# Patient Record
Sex: Female | Born: 1935 | Race: White | Hispanic: No | Marital: Married | Smoking: Never smoker
Health system: Southern US, Community
[De-identification: ages and names within clinical notes are randomized; demographics above are authoritative.]

## PROBLEM LIST (undated history)

## (undated) DIAGNOSIS — E119 Type 2 diabetes mellitus without complications: Secondary | ICD-10-CM

## (undated) DIAGNOSIS — I639 Cerebral infarction, unspecified: Secondary | ICD-10-CM

## (undated) DIAGNOSIS — F0390 Unspecified dementia without behavioral disturbance: Secondary | ICD-10-CM

## (undated) DIAGNOSIS — I509 Heart failure, unspecified: Secondary | ICD-10-CM

## (undated) HISTORY — PX: CHOLECYSTECTOMY: SHX55

---

## 1999-05-06 ENCOUNTER — Encounter: Admission: RE | Admit: 1999-05-06 | Discharge: 1999-08-04 | Payer: Self-pay | Admitting: Family Medicine

## 1999-08-20 ENCOUNTER — Encounter: Payer: Self-pay | Admitting: Family Medicine

## 1999-08-20 ENCOUNTER — Encounter: Admission: RE | Admit: 1999-08-20 | Discharge: 1999-08-20 | Payer: Self-pay | Admitting: Family Medicine

## 2000-05-13 ENCOUNTER — Ambulatory Visit: Admission: RE | Admit: 2000-05-13 | Discharge: 2000-05-13 | Payer: Self-pay | Admitting: Family Medicine

## 2000-09-17 ENCOUNTER — Encounter: Payer: Self-pay | Admitting: Family Medicine

## 2000-09-17 LAB — CONVERTED CEMR LAB: Pap Smear: NORMAL

## 2000-10-05 ENCOUNTER — Other Ambulatory Visit: Admission: RE | Admit: 2000-10-05 | Discharge: 2000-10-05 | Payer: Self-pay | Admitting: Family Medicine

## 2000-10-14 ENCOUNTER — Encounter: Admission: RE | Admit: 2000-10-14 | Discharge: 2000-10-14 | Payer: Self-pay | Admitting: Family Medicine

## 2000-10-14 ENCOUNTER — Encounter: Payer: Self-pay | Admitting: Family Medicine

## 2000-11-05 ENCOUNTER — Encounter: Admission: RE | Admit: 2000-11-05 | Discharge: 2000-11-05 | Payer: Self-pay | Admitting: Family Medicine

## 2000-11-05 ENCOUNTER — Encounter: Payer: Self-pay | Admitting: Family Medicine

## 2002-04-28 ENCOUNTER — Encounter: Payer: Self-pay | Admitting: Family Medicine

## 2002-04-28 ENCOUNTER — Encounter: Admission: RE | Admit: 2002-04-28 | Discharge: 2002-04-28 | Payer: Self-pay | Admitting: Family Medicine

## 2003-11-13 ENCOUNTER — Encounter: Admission: RE | Admit: 2003-11-13 | Discharge: 2003-11-13 | Payer: Self-pay | Admitting: Family Medicine

## 2003-11-13 ENCOUNTER — Encounter (INDEPENDENT_AMBULATORY_CARE_PROVIDER_SITE_OTHER): Payer: Self-pay | Admitting: Internal Medicine

## 2004-09-17 ENCOUNTER — Ambulatory Visit: Payer: Self-pay | Admitting: Family Medicine

## 2004-10-22 ENCOUNTER — Ambulatory Visit: Payer: Self-pay | Admitting: Family Medicine

## 2004-11-12 ENCOUNTER — Encounter (INDEPENDENT_AMBULATORY_CARE_PROVIDER_SITE_OTHER): Payer: Self-pay | Admitting: Internal Medicine

## 2004-11-24 ENCOUNTER — Ambulatory Visit: Payer: Self-pay | Admitting: Family Medicine

## 2004-12-07 ENCOUNTER — Encounter: Admission: RE | Admit: 2004-12-07 | Discharge: 2004-12-07 | Payer: Self-pay

## 2005-02-10 ENCOUNTER — Emergency Department (HOSPITAL_COMMUNITY): Admission: EM | Admit: 2005-02-10 | Discharge: 2005-02-10 | Payer: Self-pay | Admitting: Emergency Medicine

## 2005-02-11 ENCOUNTER — Ambulatory Visit: Payer: Self-pay | Admitting: Family Medicine

## 2005-02-13 ENCOUNTER — Ambulatory Visit: Payer: Self-pay | Admitting: Family Medicine

## 2005-03-24 ENCOUNTER — Ambulatory Visit: Payer: Self-pay | Admitting: Family Medicine

## 2005-05-06 ENCOUNTER — Ambulatory Visit: Payer: Self-pay | Admitting: Family Medicine

## 2005-05-27 ENCOUNTER — Ambulatory Visit: Payer: Self-pay | Admitting: Family Medicine

## 2005-05-29 ENCOUNTER — Ambulatory Visit: Payer: Self-pay | Admitting: Internal Medicine

## 2005-06-30 ENCOUNTER — Ambulatory Visit: Payer: Self-pay | Admitting: Family Medicine

## 2005-07-29 ENCOUNTER — Ambulatory Visit: Payer: Self-pay | Admitting: Family Medicine

## 2005-11-24 ENCOUNTER — Ambulatory Visit: Payer: Self-pay | Admitting: Family Medicine

## 2005-11-25 ENCOUNTER — Ambulatory Visit: Payer: Self-pay | Admitting: Cardiology

## 2005-12-01 ENCOUNTER — Ambulatory Visit: Payer: Self-pay | Admitting: Family Medicine

## 2005-12-04 ENCOUNTER — Ambulatory Visit: Payer: Self-pay | Admitting: Family Medicine

## 2005-12-08 ENCOUNTER — Ambulatory Visit: Payer: Self-pay

## 2006-01-04 ENCOUNTER — Ambulatory Visit: Payer: Self-pay | Admitting: Family Medicine

## 2006-01-14 ENCOUNTER — Encounter (INDEPENDENT_AMBULATORY_CARE_PROVIDER_SITE_OTHER): Payer: Self-pay | Admitting: Internal Medicine

## 2006-02-03 ENCOUNTER — Ambulatory Visit: Payer: Self-pay | Admitting: Family Medicine

## 2006-07-26 ENCOUNTER — Ambulatory Visit: Payer: Self-pay | Admitting: Family Medicine

## 2006-07-29 ENCOUNTER — Ambulatory Visit: Payer: Self-pay | Admitting: Family Medicine

## 2006-07-29 LAB — CONVERTED CEMR LAB
CO2: 27 meq/L (ref 19–32)
Creatinine, Ser: 1.7 mg/dL — ABNORMAL HIGH (ref 0.4–1.2)
Glucose, Bld: 136 mg/dL — ABNORMAL HIGH (ref 70–99)
Microalb, Ur: 1.2 mg/dL (ref 0.0–1.9)
Potassium: 4.2 meq/L (ref 3.5–5.1)
Sodium: 136 meq/L (ref 135–145)

## 2006-08-04 ENCOUNTER — Ambulatory Visit: Payer: Self-pay | Admitting: Family Medicine

## 2006-09-28 ENCOUNTER — Ambulatory Visit: Payer: Self-pay | Admitting: Family Medicine

## 2006-09-28 LAB — CONVERTED CEMR LAB
BUN: 28 mg/dL — ABNORMAL HIGH (ref 6–23)
CO2: 27 meq/L (ref 19–32)
Calcium: 9.5 mg/dL (ref 8.4–10.5)
Chloride: 105 meq/L (ref 96–112)
Creatinine, Ser: 1.2 mg/dL (ref 0.4–1.2)
GFR calc Af Amer: 55 mL/min

## 2006-10-28 ENCOUNTER — Encounter: Payer: Self-pay | Admitting: Family Medicine

## 2006-10-28 DIAGNOSIS — E785 Hyperlipidemia, unspecified: Secondary | ICD-10-CM | POA: Insufficient documentation

## 2006-10-28 DIAGNOSIS — M1990 Unspecified osteoarthritis, unspecified site: Secondary | ICD-10-CM | POA: Insufficient documentation

## 2006-10-28 DIAGNOSIS — N6019 Diffuse cystic mastopathy of unspecified breast: Secondary | ICD-10-CM

## 2006-10-28 DIAGNOSIS — M109 Gout, unspecified: Secondary | ICD-10-CM

## 2006-10-28 DIAGNOSIS — K449 Diaphragmatic hernia without obstruction or gangrene: Secondary | ICD-10-CM | POA: Insufficient documentation

## 2006-10-28 DIAGNOSIS — Z8669 Personal history of other diseases of the nervous system and sense organs: Secondary | ICD-10-CM | POA: Insufficient documentation

## 2006-10-28 DIAGNOSIS — N390 Urinary tract infection, site not specified: Secondary | ICD-10-CM

## 2006-10-28 DIAGNOSIS — I509 Heart failure, unspecified: Secondary | ICD-10-CM | POA: Insufficient documentation

## 2006-12-06 ENCOUNTER — Telehealth (INDEPENDENT_AMBULATORY_CARE_PROVIDER_SITE_OTHER): Payer: Self-pay | Admitting: Internal Medicine

## 2006-12-06 ENCOUNTER — Ambulatory Visit: Payer: Self-pay | Admitting: Internal Medicine

## 2006-12-06 DIAGNOSIS — I10 Essential (primary) hypertension: Secondary | ICD-10-CM

## 2006-12-06 DIAGNOSIS — F3289 Other specified depressive episodes: Secondary | ICD-10-CM | POA: Insufficient documentation

## 2006-12-06 DIAGNOSIS — E119 Type 2 diabetes mellitus without complications: Secondary | ICD-10-CM | POA: Insufficient documentation

## 2006-12-06 DIAGNOSIS — R109 Unspecified abdominal pain: Secondary | ICD-10-CM | POA: Insufficient documentation

## 2006-12-06 DIAGNOSIS — F329 Major depressive disorder, single episode, unspecified: Secondary | ICD-10-CM

## 2006-12-08 ENCOUNTER — Encounter (INDEPENDENT_AMBULATORY_CARE_PROVIDER_SITE_OTHER): Payer: Self-pay | Admitting: Internal Medicine

## 2006-12-08 ENCOUNTER — Ambulatory Visit (HOSPITAL_COMMUNITY): Admission: RE | Admit: 2006-12-08 | Discharge: 2006-12-08 | Payer: Self-pay | Admitting: *Deleted

## 2007-01-07 ENCOUNTER — Ambulatory Visit: Payer: Self-pay | Admitting: Internal Medicine

## 2007-01-07 DIAGNOSIS — B029 Zoster without complications: Secondary | ICD-10-CM | POA: Insufficient documentation

## 2007-01-25 ENCOUNTER — Encounter (INDEPENDENT_AMBULATORY_CARE_PROVIDER_SITE_OTHER): Payer: Self-pay | Admitting: Internal Medicine

## 2007-04-14 ENCOUNTER — Ambulatory Visit: Payer: Self-pay | Admitting: Family Medicine

## 2007-04-14 DIAGNOSIS — S92209A Fracture of unspecified tarsal bone(s) of unspecified foot, initial encounter for closed fracture: Secondary | ICD-10-CM

## 2007-04-14 DIAGNOSIS — S92309A Fracture of unspecified metatarsal bone(s), unspecified foot, initial encounter for closed fracture: Secondary | ICD-10-CM

## 2007-04-18 LAB — CONVERTED CEMR LAB: Hgb A1c MFr Bld: 6.3 % — ABNORMAL HIGH (ref 4.6–6.0)

## 2007-05-13 ENCOUNTER — Ambulatory Visit: Payer: Self-pay | Admitting: Family Medicine

## 2007-07-18 ENCOUNTER — Ambulatory Visit: Payer: Self-pay | Admitting: Family Medicine

## 2007-07-18 DIAGNOSIS — R634 Abnormal weight loss: Secondary | ICD-10-CM | POA: Insufficient documentation

## 2007-07-19 LAB — CONVERTED CEMR LAB
AST: 15 units/L (ref 0–37)
CO2: 28 meq/L (ref 19–32)
Calcium: 9.5 mg/dL (ref 8.4–10.5)
Cholesterol: 197 mg/dL (ref 0–200)
GFR calc Af Amer: 42 mL/min
GFR calc non Af Amer: 35 mL/min
Glucose, Bld: 101 mg/dL — ABNORMAL HIGH (ref 70–99)
HDL: 34.2 mg/dL — ABNORMAL LOW (ref >39.0)
LDL Cholesterol: 128 mg/dL — ABNORMAL HIGH (ref 0–99)
Microalb Creat Ratio: 11.5 mg/g (ref 0.0–30.0)
Potassium: 4.2 meq/L (ref 3.5–5.1)
Sodium: 143 meq/L (ref 135–145)
Total CHOL/HDL Ratio: 5.8
Triglycerides: 174 mg/dL — ABNORMAL HIGH (ref 0–149)

## 2007-08-01 ENCOUNTER — Ambulatory Visit: Payer: Self-pay | Admitting: Family Medicine

## 2007-08-01 DIAGNOSIS — N259 Disorder resulting from impaired renal tubular function, unspecified: Secondary | ICD-10-CM | POA: Insufficient documentation

## 2007-08-05 ENCOUNTER — Ambulatory Visit: Payer: Self-pay | Admitting: Family Medicine

## 2007-08-09 ENCOUNTER — Encounter (INDEPENDENT_AMBULATORY_CARE_PROVIDER_SITE_OTHER): Payer: Self-pay | Admitting: *Deleted

## 2007-08-15 ENCOUNTER — Ambulatory Visit: Payer: Self-pay | Admitting: Family Medicine

## 2007-08-18 ENCOUNTER — Telehealth (INDEPENDENT_AMBULATORY_CARE_PROVIDER_SITE_OTHER): Payer: Self-pay | Admitting: Internal Medicine

## 2007-08-22 LAB — CONVERTED CEMR LAB
AST: 19 units/L (ref 0–37)
Cholesterol: 145 mg/dL (ref 0–200)
HDL: 36.6 mg/dL — ABNORMAL LOW (ref >39.0)
Total CHOL/HDL Ratio: 4

## 2007-08-23 ENCOUNTER — Encounter (INDEPENDENT_AMBULATORY_CARE_PROVIDER_SITE_OTHER): Payer: Self-pay | Admitting: Internal Medicine

## 2007-09-12 ENCOUNTER — Ambulatory Visit: Payer: Self-pay | Admitting: Family Medicine

## 2007-10-04 ENCOUNTER — Ambulatory Visit: Payer: Self-pay | Admitting: Family Medicine

## 2007-10-10 LAB — CONVERTED CEMR LAB
ALT: 14 units/L (ref 0–35)
LDL Cholesterol: 98 mg/dL (ref 0–99)
Total CHOL/HDL Ratio: 4.4
Triglycerides: 143 mg/dL (ref 0–149)

## 2007-11-07 ENCOUNTER — Ambulatory Visit: Payer: Self-pay | Admitting: Family Medicine

## 2007-11-07 ENCOUNTER — Telehealth (INDEPENDENT_AMBULATORY_CARE_PROVIDER_SITE_OTHER): Payer: Self-pay | Admitting: Internal Medicine

## 2007-11-07 DIAGNOSIS — R1902 Left upper quadrant abdominal swelling, mass and lump: Secondary | ICD-10-CM

## 2007-11-08 ENCOUNTER — Encounter (INDEPENDENT_AMBULATORY_CARE_PROVIDER_SITE_OTHER): Payer: Self-pay | Admitting: Internal Medicine

## 2007-11-08 ENCOUNTER — Telehealth (INDEPENDENT_AMBULATORY_CARE_PROVIDER_SITE_OTHER): Payer: Self-pay | Admitting: Internal Medicine

## 2007-11-08 ENCOUNTER — Ambulatory Visit: Payer: Self-pay | Admitting: Cardiology

## 2007-11-08 LAB — CONVERTED CEMR LAB
BUN: 30 mg/dL — ABNORMAL HIGH (ref 6–23)
CO2: 26 meq/L (ref 19–32)
Calcium: 9.6 mg/dL (ref 8.4–10.5)
Chloride: 109 meq/L (ref 96–112)
Creatinine, Ser: 1.3 mg/dL — ABNORMAL HIGH (ref 0.4–1.2)
GFR calc non Af Amer: 41 mL/min

## 2007-12-13 ENCOUNTER — Ambulatory Visit: Payer: Self-pay | Admitting: Family Medicine

## 2008-04-04 ENCOUNTER — Encounter (INDEPENDENT_AMBULATORY_CARE_PROVIDER_SITE_OTHER): Payer: Self-pay | Admitting: Internal Medicine

## 2008-04-06 ENCOUNTER — Ambulatory Visit: Payer: Self-pay | Admitting: Family Medicine

## 2008-04-06 ENCOUNTER — Encounter (INDEPENDENT_AMBULATORY_CARE_PROVIDER_SITE_OTHER): Payer: Self-pay | Admitting: Internal Medicine

## 2008-04-10 LAB — CONVERTED CEMR LAB
GFR calc Af Amer: 50 mL/min
GFR calc non Af Amer: 41 mL/min
LDL Cholesterol: 126 mg/dL — ABNORMAL HIGH (ref 0–99)
Microalb Creat Ratio: 19.5 mg/g (ref 0.0–30.0)
Microalb, Ur: 1.8 mg/dL (ref 0.0–1.9)
Potassium: 4.6 meq/L (ref 3.5–5.1)
Sodium: 145 meq/L (ref 135–145)
VLDL: 32 mg/dL (ref 0–40)

## 2008-04-11 ENCOUNTER — Telehealth (INDEPENDENT_AMBULATORY_CARE_PROVIDER_SITE_OTHER): Payer: Self-pay | Admitting: Internal Medicine

## 2008-05-22 ENCOUNTER — Ambulatory Visit: Payer: Self-pay | Admitting: Family Medicine

## 2008-05-24 LAB — CONVERTED CEMR LAB
ALT: 17 units/L (ref 0–35)
HDL: 32.6 mg/dL — ABNORMAL LOW (ref >39.0)
LDL Cholesterol: 87 mg/dL (ref 0–99)
Total Bilirubin: 0.7 mg/dL (ref 0.3–1.2)
Total CHOL/HDL Ratio: 4.6
VLDL: 31 mg/dL (ref 0–40)

## 2008-07-05 ENCOUNTER — Ambulatory Visit: Payer: Self-pay | Admitting: Family Medicine

## 2008-07-10 LAB — CONVERTED CEMR LAB
Creatinine,U: 77.3 mg/dL
Hgb A1c MFr Bld: 6.2 % — ABNORMAL HIGH (ref 4.6–6.0)
Microalb, Ur: 0.7 mg/dL (ref 0.0–1.9)

## 2008-07-27 ENCOUNTER — Encounter (INDEPENDENT_AMBULATORY_CARE_PROVIDER_SITE_OTHER): Payer: Self-pay | Admitting: Internal Medicine

## 2008-10-02 ENCOUNTER — Ambulatory Visit: Payer: Self-pay | Admitting: Family Medicine

## 2008-10-04 LAB — CONVERTED CEMR LAB: Hgb A1c MFr Bld: 6.3 % (ref 4.6–6.5)

## 2008-11-07 ENCOUNTER — Telehealth (INDEPENDENT_AMBULATORY_CARE_PROVIDER_SITE_OTHER): Payer: Self-pay | Admitting: Internal Medicine

## 2008-11-07 ENCOUNTER — Encounter (INDEPENDENT_AMBULATORY_CARE_PROVIDER_SITE_OTHER): Payer: Self-pay | Admitting: Internal Medicine

## 2008-11-07 ENCOUNTER — Ambulatory Visit: Payer: Self-pay | Admitting: Family Medicine

## 2008-11-07 DIAGNOSIS — R079 Chest pain, unspecified: Secondary | ICD-10-CM

## 2008-11-07 DIAGNOSIS — M1710 Unilateral primary osteoarthritis, unspecified knee: Secondary | ICD-10-CM

## 2009-01-03 ENCOUNTER — Ambulatory Visit: Payer: Self-pay | Admitting: Family Medicine

## 2009-01-03 ENCOUNTER — Encounter (INDEPENDENT_AMBULATORY_CARE_PROVIDER_SITE_OTHER): Payer: Self-pay | Admitting: Internal Medicine

## 2009-01-03 DIAGNOSIS — I69959 Hemiplegia and hemiparesis following unspecified cerebrovascular disease affecting unspecified side: Secondary | ICD-10-CM | POA: Insufficient documentation

## 2009-01-04 ENCOUNTER — Encounter: Admission: RE | Admit: 2009-01-04 | Discharge: 2009-01-04 | Payer: Self-pay | Admitting: Family Medicine

## 2009-01-05 ENCOUNTER — Encounter: Admission: RE | Admit: 2009-01-05 | Discharge: 2009-01-05 | Payer: Self-pay | Admitting: Family Medicine

## 2009-01-09 ENCOUNTER — Telehealth (INDEPENDENT_AMBULATORY_CARE_PROVIDER_SITE_OTHER): Payer: Self-pay | Admitting: Internal Medicine

## 2009-01-10 ENCOUNTER — Ambulatory Visit: Payer: Self-pay | Admitting: Family Medicine

## 2009-01-11 LAB — CONVERTED CEMR LAB
ALT: 76 units/L — ABNORMAL HIGH (ref 0–35)
HDL: 37.5 mg/dL — ABNORMAL LOW (ref >39.00)
Total CHOL/HDL Ratio: 4

## 2009-03-12 ENCOUNTER — Ambulatory Visit: Payer: Self-pay | Admitting: Family Medicine

## 2009-03-21 ENCOUNTER — Encounter (INDEPENDENT_AMBULATORY_CARE_PROVIDER_SITE_OTHER): Payer: Self-pay | Admitting: Internal Medicine

## 2009-04-01 ENCOUNTER — Encounter (INDEPENDENT_AMBULATORY_CARE_PROVIDER_SITE_OTHER): Payer: Self-pay | Admitting: Internal Medicine

## 2009-04-16 ENCOUNTER — Ambulatory Visit: Payer: Self-pay | Admitting: Family Medicine

## 2009-04-30 ENCOUNTER — Emergency Department (HOSPITAL_COMMUNITY): Admission: EM | Admit: 2009-04-30 | Discharge: 2009-04-30 | Payer: Self-pay | Admitting: Emergency Medicine

## 2009-05-01 ENCOUNTER — Ambulatory Visit: Payer: Self-pay | Admitting: Family Medicine

## 2009-05-01 DIAGNOSIS — T887XXA Unspecified adverse effect of drug or medicament, initial encounter: Secondary | ICD-10-CM | POA: Insufficient documentation

## 2009-05-01 DIAGNOSIS — M25519 Pain in unspecified shoulder: Secondary | ICD-10-CM

## 2009-05-02 ENCOUNTER — Telehealth: Payer: Self-pay | Admitting: Family Medicine

## 2009-05-02 ENCOUNTER — Telehealth (INDEPENDENT_AMBULATORY_CARE_PROVIDER_SITE_OTHER): Payer: Self-pay | Admitting: Internal Medicine

## 2009-05-03 ENCOUNTER — Telehealth (INDEPENDENT_AMBULATORY_CARE_PROVIDER_SITE_OTHER): Payer: Self-pay | Admitting: Internal Medicine

## 2009-05-05 ENCOUNTER — Inpatient Hospital Stay (HOSPITAL_COMMUNITY): Admission: EM | Admit: 2009-05-05 | Discharge: 2009-05-07 | Payer: Self-pay | Admitting: Emergency Medicine

## 2009-05-08 ENCOUNTER — Telehealth: Payer: Self-pay | Admitting: Family Medicine

## 2009-05-10 ENCOUNTER — Telehealth: Payer: Self-pay | Admitting: Internal Medicine

## 2009-05-13 ENCOUNTER — Telehealth (INDEPENDENT_AMBULATORY_CARE_PROVIDER_SITE_OTHER): Payer: Self-pay | Admitting: Internal Medicine

## 2009-05-14 ENCOUNTER — Ambulatory Visit: Payer: Self-pay | Admitting: Family Medicine

## 2009-05-15 ENCOUNTER — Telehealth (INDEPENDENT_AMBULATORY_CARE_PROVIDER_SITE_OTHER): Payer: Self-pay | Admitting: Internal Medicine

## 2009-05-20 ENCOUNTER — Encounter (INDEPENDENT_AMBULATORY_CARE_PROVIDER_SITE_OTHER): Payer: Self-pay | Admitting: Internal Medicine

## 2009-05-21 ENCOUNTER — Telehealth (INDEPENDENT_AMBULATORY_CARE_PROVIDER_SITE_OTHER): Payer: Self-pay | Admitting: Internal Medicine

## 2009-05-24 ENCOUNTER — Telehealth (INDEPENDENT_AMBULATORY_CARE_PROVIDER_SITE_OTHER): Payer: Self-pay | Admitting: Internal Medicine

## 2009-05-27 ENCOUNTER — Telehealth: Payer: Self-pay | Admitting: Family Medicine

## 2009-05-28 ENCOUNTER — Ambulatory Visit: Payer: Self-pay | Admitting: Family Medicine

## 2009-05-28 DIAGNOSIS — F341 Dysthymic disorder: Secondary | ICD-10-CM | POA: Insufficient documentation

## 2009-05-30 ENCOUNTER — Encounter: Payer: Self-pay | Admitting: Family Medicine

## 2009-05-31 ENCOUNTER — Telehealth (INDEPENDENT_AMBULATORY_CARE_PROVIDER_SITE_OTHER): Payer: Self-pay | Admitting: Internal Medicine

## 2009-06-04 ENCOUNTER — Telehealth (INDEPENDENT_AMBULATORY_CARE_PROVIDER_SITE_OTHER): Payer: Self-pay | Admitting: Internal Medicine

## 2009-06-06 ENCOUNTER — Telehealth (INDEPENDENT_AMBULATORY_CARE_PROVIDER_SITE_OTHER): Payer: Self-pay | Admitting: Internal Medicine

## 2009-06-19 ENCOUNTER — Ambulatory Visit: Payer: Self-pay | Admitting: Family Medicine

## 2009-06-24 ENCOUNTER — Telehealth (INDEPENDENT_AMBULATORY_CARE_PROVIDER_SITE_OTHER): Payer: Self-pay | Admitting: Internal Medicine

## 2009-06-26 LAB — CONVERTED CEMR LAB
BUN: 19 mg/dL (ref 6–23)
Eosinophils Relative: 2.8 % (ref 0.0–5.0)
GFR calc non Af Amer: 49.79 mL/min (ref >60)
HCT: 41.3 % (ref 36.0–46.0)
Hgb A1c MFr Bld: 6.4 % (ref 4.6–6.5)
Lymphs Abs: 2.9 10*3/uL (ref 0.7–4.0)
Monocytes Relative: 5.2 % (ref 3.0–12.0)
Neutrophils Relative %: 66.7 % (ref 43.0–77.0)
Platelets: 226 10*3/uL (ref 150.0–400.0)
Potassium: 4.3 meq/L (ref 3.5–5.1)
TSH: 2.63 microintl units/mL (ref 0.35–5.50)
Total Bilirubin: 0.7 mg/dL (ref 0.3–1.2)
WBC: 11.8 10*3/uL — ABNORMAL HIGH (ref 4.5–10.5)

## 2009-07-01 ENCOUNTER — Telehealth (INDEPENDENT_AMBULATORY_CARE_PROVIDER_SITE_OTHER): Payer: Self-pay | Admitting: Internal Medicine

## 2009-07-02 ENCOUNTER — Telehealth (INDEPENDENT_AMBULATORY_CARE_PROVIDER_SITE_OTHER): Payer: Self-pay | Admitting: Internal Medicine

## 2009-07-02 ENCOUNTER — Ambulatory Visit: Payer: Self-pay | Admitting: Family Medicine

## 2009-07-08 ENCOUNTER — Encounter: Admission: RE | Admit: 2009-07-08 | Discharge: 2009-07-08 | Payer: Self-pay | Admitting: Orthopedic Surgery

## 2009-07-10 ENCOUNTER — Telehealth (INDEPENDENT_AMBULATORY_CARE_PROVIDER_SITE_OTHER): Payer: Self-pay | Admitting: Internal Medicine

## 2009-09-12 ENCOUNTER — Ambulatory Visit: Payer: Self-pay | Admitting: Surgery

## 2009-09-12 ENCOUNTER — Encounter (INDEPENDENT_AMBULATORY_CARE_PROVIDER_SITE_OTHER): Payer: Self-pay | Admitting: Emergency Medicine

## 2009-09-12 ENCOUNTER — Inpatient Hospital Stay (HOSPITAL_COMMUNITY): Admission: EM | Admit: 2009-09-12 | Discharge: 2009-09-16 | Payer: Self-pay | Admitting: Emergency Medicine

## 2009-09-12 ENCOUNTER — Ambulatory Visit: Payer: Self-pay | Admitting: Internal Medicine

## 2009-09-13 ENCOUNTER — Encounter (INDEPENDENT_AMBULATORY_CARE_PROVIDER_SITE_OTHER): Payer: Self-pay | Admitting: Internal Medicine

## 2009-10-04 ENCOUNTER — Encounter: Admission: RE | Admit: 2009-10-04 | Discharge: 2009-10-04 | Payer: Self-pay | Admitting: Internal Medicine

## 2009-10-28 ENCOUNTER — Telehealth: Payer: Self-pay | Admitting: Internal Medicine

## 2009-12-20 ENCOUNTER — Inpatient Hospital Stay (HOSPITAL_COMMUNITY)
Admission: EM | Admit: 2009-12-20 | Discharge: 2009-12-24 | Payer: Self-pay | Source: Home / Self Care | Admitting: Emergency Medicine

## 2009-12-20 ENCOUNTER — Ambulatory Visit: Payer: Self-pay | Admitting: Cardiology

## 2009-12-22 ENCOUNTER — Encounter (INDEPENDENT_AMBULATORY_CARE_PROVIDER_SITE_OTHER): Payer: Self-pay | Admitting: Internal Medicine

## 2009-12-23 ENCOUNTER — Encounter: Payer: Self-pay | Admitting: Internal Medicine

## 2010-01-08 ENCOUNTER — Encounter: Payer: Self-pay | Admitting: Cardiology

## 2010-01-08 DIAGNOSIS — I6350 Cerebral infarction due to unspecified occlusion or stenosis of unspecified cerebral artery: Secondary | ICD-10-CM | POA: Insufficient documentation

## 2010-01-13 ENCOUNTER — Ambulatory Visit: Payer: Self-pay | Admitting: Cardiology

## 2010-03-04 ENCOUNTER — Ambulatory Visit: Payer: Self-pay | Admitting: Cardiology

## 2010-07-10 ENCOUNTER — Inpatient Hospital Stay (HOSPITAL_COMMUNITY)
Admission: EM | Admit: 2010-07-10 | Discharge: 2010-07-15 | Payer: Self-pay | Source: Home / Self Care | Attending: Internal Medicine | Admitting: Internal Medicine

## 2010-08-15 NOTE — H&P (Signed)
NAMEPHEONIX, WISBY NO.:  [id number]  MEDICAL RECORD NO.:  [id number]          PATIENT TYPE:  INP  LOCATION:  1408                         FACILITY:  [facility]  PHYSICIAN:  Massie Maroon, MD        DATE OF BIRTH:  1919-01-18  DATE OF ADMISSION:  07/10/2010 DATE OF DISCHARGE:                             HISTORY & PHYSICAL   CHIEF COMPLAINT:  Cold.  HISTORY OF PRESENT ILLNESS:  An 77 year old female with a history of CVA, hypertension, CHF, apparently complains of cold beginning about 10 days ago.  She was apparently treated with Avelox.  Today was supposed to be her last day of Avelox but she seem more short of breath and was having a nonproductive cough and went to see her primary care physician and was seen by nurse practitioner.  Apparently, her lungs sounded like she had congestive heart failure and she was sent to the ER for evaluation.  The patient was evaluated in the ED and chest x-ray was negative for any acute process.  BNP was normal.  Cardiac markers were negative.  The patient sounded wheezing on exam and is thought to possibly have COPD exacerbation.  PAST MEDICAL HISTORY: 1. Cerebellar stroke with single infarct in the right frontal region     as well, possibly embolic, December 20, 2009. 2. Diabetes, type 2. 3. Hypertension. 4. Hyperlipidemia. 5. CHF (remote past), EF 80% on stress test Oliveri 22, 2007, status post     adenosine Myoview Standlee 22, 2007, negative for ischemia, status post     transesophageal echo which showed EF 60-65%, descending aorta     moderately calcified, negative bubble study. 6. Arthritis/gout. 7. Shingles, October 2010. 8. Pneumonia. 9. Right rotator cuff tear.  PAST SURGICAL HISTORY: 1. Cholecystectomy. 2. Bilateral cataract surgery.  SOCIAL HISTORY:  The patient lives at home with her husband.  She has been married for 70 years.  She does not smoke or drink.  She raised children.  She worked only briefly outside the home  for about 4 years. She was not able to tell me what kind of work she did.  FAMILY HISTORY:  Mother died at age 73 from a stroke and was a nonsmoker.  Her father died at age 63 of aneurysm and a heart attack and was a nonsmoker.  She had 11 siblings, 2 of whom are alive, most died of heart problems.  REVIEW OF SYSTEMS:  Negative for all 10 organ systems except for pertinent positives stated above.  Specifically negative for fever, possibly positive for slight weight gain and negative for lower extremity edema, otherwise negative for all 10 organ systems except for pertinent positives stated above.  ALLERGIES:  COUMADIN.  MEDICATIONS: 1. Tramadol 25-50 mg q.6h. p.r.n. 2. Quinapril 5 mg p.o. daily. 3. Cartia XT 120 mg p.o. daily. 4. Allopurinol 300 mg p.o. daily. 5. Metformin 5 mg p.o. daily p.r.n. 6. Furosemide 40 mg p.o. daily. 7. Xanax 0.25 mg p.o. b.i.d. p.r.n. 8. Vitamin D 1000 international units p.o. daily. 9. Plavix 75 mg p.o. daily. 10.Citalopram 20 mg p.o. b.i.d. 11.Lovastatin 40  mg p.o. at bedtime.  PHYSICAL EXAM:  VITAL SIGNS:  Temperature 98.2, pulse 75, blood pressure 128/67, pulse ox is 95-96% on room air. HEENT:  Anicteric, EOMI, no nystagmus, small oropharynx. NECK:  No JVD, no bruit. HEART:  Regular rate and rhythm.  S1 and S2.  No murmurs, gallops or rubs. LUNGS:  Positive bibasilar crackles, one quarter of the lung.  Slight expiratory wheeze in bilateral lung fields. ABDOMEN:  Soft, obese, nontender, nondistended.  Positive bowel sounds. EXTREMITIES:  No cyanosis, clubbing or edema. SKIN:  No rashes. LYMPH NODES:  No adenopathy. NEURO EXAM:  Nonfocal.  LABORATORY DATA:  Troponin-I less than 0.05, BNP 73.2.  Sodium 137, potassium 4.6, BUN 18, creatinine 1.15, D-dimer 0.55.  Chest x-ray, negative for any acute process, cardiomegaly, retrocardiac atelectasis, chronic elevated right hemidiaphragm and associated atelectasis.  ASSESSMENT/PLAN: 1. Dyspnea  likely secondary to chronic obstructive pulmonary disease     exacerbation, the patient will be continued on Avelox 40 mg IV     daily and treated with Solu-Medrol 80 mg IV daily and started on     Spiriva 1 puff daily and treated with Xopenex neb one p.o. q.8h.     and q.6h. p.r.n. 2. Diabetes, type 2:  Fingerstick blood sugars a.c. and at bedtime,     NovoLog sliding scale with at bedtime coverage. 3. CHF (EF 60-65%):  Continue furosemide 40 mg p.o. daily, Plavix 75     mg p.o. daily, quinapril 5 mg p.o. daily, and Cartia XT 120 mg p.o.     daily. 4. Gout:  Continue allopurinol 300 mg p.o. daily. 5. Hyperlipidemia:  Continue lovastatin 40 mg p.o. at bedtime. 6. Depression:  Continue citalopram 20 mg p.o. b.i.d. 7. Chronic arthritis:  Continue tramadol as needed. 8. Insomnia/anxiety:  Xanax 0.25 mg p.o. at bedtime p.r.n. insomnia. 9. Deep venous thrombosis prophylaxis, Lovenox.     Massie Maroon, MD     JYK/MEDQ  D:  07/10/2010  T:  07/10/2010  Job:  [id number]  cc:   Georgianne Fick, M.D. Fax: [phone]  Joycelyn Schmid, MD Fax: [phone]  Electronically Signed by Pearson Grippe MD on 08/15/2010 08:34:13 PM

## 2010-08-21 NOTE — Miscellaneous (Signed)
  Clinical Lists Changes  Problems: Added new problem of CVA (ICD-434.91) Observations: Added new observation of PAST MED HX: Hyperlipidemia Congestive heart failure Diabetes mellitus, type II (9.2000) Gout Osteopenia (4.2002) Cerebellar Stroke...12/20/2009...with single infarct right frontal also...possibly embolic...carotid dopplers OK.Marland KitchenMarland KitchenMarland KitchenPlan plavix indefinitely  (consider event recording to see if atrial fib is found) dysphagia   12/2009 Fall...mechanical at time of strike.Marland KitchenMarland Kitchen6/2011 EF  70%...echo..12/22/2009 /  TEE..12/2009...no clot (01/08/2010 12:41)       Past History:  Past Medical History: Hyperlipidemia Congestive heart failure Diabetes mellitus, type II (9.2000) Gout Osteopenia (4.2002) Cerebellar Stroke...12/20/2009...with single infarct right frontal also...possibly embolic...carotid dopplers OK.Marland KitchenMarland KitchenMarland KitchenPlan plavix indefinitely  (consider event recording to see if atrial fib is found) dysphagia   12/2009 Fall...mechanical at time of strike.Marland KitchenMarland Kitchen6/2011 EF  70%...echo..12/22/2009 /  TEE..12/2009...no clot

## 2010-08-21 NOTE — Assessment & Plan Note (Signed)
Summary: eph   Visit Type:  Initial Consult Referring Provider:  Pearlean Brownie Primary Provider:  Georgianne Fick, MD  CC:  rule out atrial fibrillation.  History of Present Illness: The patient is seen to help assess whether or not she has paroxysmal atrial fibrillation.  The patient was hospitalized from June 3 until December 24, 2009.  She had an acute cerebellar stroke .  There was also a single infarct in the right frontal region it was possibly embolic.  No arrhythmias were documented.  Patient underwent echo and transesophageal echo.  Ejection fraction was 70%.  TEE revealed no clot.  Therefore it is not completely clear if the patient has a reason to have emboli on a cardiac basis.  The patient was discharged on Plavix.  I am asked to see her to help decide if there is atrial fibrillation.  If atrial fib as documented, Coumadin would be the drug of choice.  The patient has no palpitations.  There is no prior history of coronary disease or atrial fibrillation.  The patient has chronic shortness of breath.  Her daughter is here to explain that this is a very old problem.  The patient didn't feel short of breath at any time.  He does not sound like any cardiopulmonary problem.  Current Medications (verified): 1)  Allopurinol 300 Mg Tabs (Allopurinol) .... Take 1 Tablet By Mouth Once A Day 2)  Lovastatin 40 Mg  Tabs (Lovastatin) .... Take 1 Tablet By Mouth Once A Day 3)  Furosemide 40 Mg Tabs (Furosemide) .... Take 1 Tablet By Mouth Once A Day 4)  One Touch Ultra Test Strips .... Check Blood Sugar Daily--250.00 5)  Tramadol Hcl 50 Mg Tabs (Tramadol Hcl) .... One Half Tablet As Needed Up  To Four Times A Day For Pain. 6)  Tylenol 325 Mg Tabs (Acetaminophen) .... Otc As Directed. 7)  Vitamin D 3 .... One Daily. Otc 8)  Alprazolam 0.25 Mg Tabs (Alprazolam) .... 1/2 To 1  At Bedtime As Needed Rest/sleep 9)  Icaps  Caps (Multiple Vitamins-Minerals) .... Take 1 Capsule By Mouth Once A Day 10)  Diltiazem  Hcl Er Beads 120 Mg Xr24h-Cap (Diltiazem Hcl Er Beads) .Marland Kitchen.. 1 Daily 11)  Quinapril Hcl 10 Mg Tabs (Quinapril Hcl) .... Once Daily 12)  Plavix 75 Mg Tabs (Clopidogrel Bisulfate) .... Take One Tablet By Mouth Daily 13)  Citalopram Hydrobromide 20 Mg Tabs (Citalopram Hydrobromide) .... Once Daily  Allergies (verified): 1)  ! Codeine 2)  ! * Captopril 3)  ! Zoloft  Past History:  Past Medical History: Hyperlipidemia Congestive heart failure Diabetes mellitus, type II (9.2000) Gout Osteopenia (4.2002) Cerebellar Stroke...12/20/2009...with single infarct right frontal also...possibly embolic...carotid dopplers OK.Marland KitchenMarland KitchenMarland KitchenPlan plavix indefinitely  (consider event recording to see if atrial fib is found) dysphagia   12/2009 Fall...mechanical at time of strike.Marland KitchenMarland Kitchen6/2011 EF  70%...echo..12/22/2009 /  TEE..12/2009...no clot  Review of Systems       Patient denies fever, chills, headache, sweats, rash, change in vision, change in hearing, chest pain, cough, nausea vomiting, urinary symptoms.  All of the systems are reviewed and are negative.  Vital Signs:  Patient profile:   77 year old female Height:      61.5 inches Weight:      153 pounds BMI:     28.54 Pulse rate:   80 / minute BP sitting:   116 / 68  (left arm) Cuff size:   regular  Vitals Entered By: Hardin Negus, RMA (January 13, 2010 11:00 AM)  Physical Exam  General:  The patient is overweight but stable. Head:  head is atraumatic. Eyes:  no xanthelasma. Neck:  no jugular venous distention. Chest Wall:  no chest wall tenderness. Lungs:  lungs are clear.  Respiratory effort is not labored. Heart:  cardiac exam reveals S1-S2.  No clicks or significant murmurs. Abdomen:  abdomen is soft. Msk:  there is some kyphosis. Extremities:  no peripheral edema  Skin:  no skin rashes. Psych:  patient is oriented to person time and place.  Affect is normal.   Impression & Recommendations:  Problem # 1:  CVA (ICD-434.91)  The patient is on  Plavix.  She was discharged on this by neurology and the primary care team.  It is my understanding that Coumadin will be started only if we document H. or fibrillation.  I will be in touch with Dr.Sethi to review whether the patient should be on Plavix alone or aspirin plus Plavix.  We will proceed with a event recorder to see if there is any evidence of atrial fibrillation at home.  Echo and TEE have already been done. As part of the evaluation today I have reviewed completely the discharge summary patient's hospitalization along with the consult notes from that hospitalization and the reports of the echo and TEE.  Orders: Event (Event)  Problem # 2:  CHEST PAIN (ICD-786.50)  The following medications were removed from the medication list:    Aspirin 81 Mg Tabs (Aspirin) ..... One daily Her updated medication list for this problem includes:    Diltiazem Hcl Er Beads 120 Mg Xr24h-cap (Diltiazem hcl er beads) .Marland Kitchen... 1 daily    Quinapril Hcl 10 Mg Tabs (Quinapril hcl) ..... Once daily    Plavix 75 Mg Tabs (Clopidogrel bisulfate) .Marland Kitchen... Take one tablet by mouth daily  Orders: EKG w/ Interpretation (93000) The patient is not having any significant chest pain at this time.  EKG is done and reviewed by me.  There is normal sinus rhythm.  Problem # 3:  HYPERTENSION, BENIGN ESSENTIAL (ICD-401.1)  The following medications were removed from the medication list:    Aspirin 81 Mg Tabs (Aspirin) ..... One daily Her updated medication list for this problem includes:    Furosemide 40 Mg Tabs (Furosemide) .Marland Kitchen... Take 1 tablet by mouth once a day    Diltiazem Hcl Er Beads 120 Mg Xr24h-cap (Diltiazem hcl er beads) .Marland Kitchen... 1 daily    Quinapril Hcl 10 Mg Tabs (Quinapril hcl) ..... Once daily Blood pressure is well controlled.  No change in therapy.  Problem # 4:  CONGESTIVE HEART FAILURE (ICD-428.0) There is a history of congestive heart failure.  Patient takes low-dose diuretics.  This must be on the basis  of diastolic dysfunction.  We know that she has excellent systolic function.  No definite change in therapy.  Patient Instructions: 1)  Your physician has recommended that you wear an event monitor.  Event monitors are medical devices that record the heart's electrical activity. Doctors most often use these monitors to diagnose arrhythmias. Arrhythmias are problems with the speed or rhythm of the heartbeat. The monitor is a small, portable device. You can wear one while you do your normal daily activities. This is usually used to diagnose what is causing palpitations/syncope (passing out). 2)  Follow up in 4 weeks

## 2010-08-21 NOTE — Assessment & Plan Note (Signed)
Summary: f/u event monitor   Visit Type:  Follow-up Referring Provider:  Pearlean Brownie Primary Provider:  Georgianne Fick, MD  CC:  rule out atrial fibrillation.  History of Present Illness: The patient is seen for cardiology followup.  I saw her last January 13, 2010.  She had a neurologic event and we wanted to be sure that there was no evidence of atrial fibrillation.  She wore a 21 day event recorder.  She had sinus rhythm and there was no evidence of atrial fibrillation at any time.  Therefore she will not need Coumadin.  The family also asked me about the approach to her fluid status.  She has diastolic congestive heart failure.  She needs to keep her home weight around 152 pounds on her scale.  When she was dehydrated at one time, she was instructed to drink extra fluid.  I made clear to her that extra fluid wouldn't only help her if she is dehydrated.  I explained to the patient and her daughter that she should drink fluids in moderation with only mild extra fluid during the day.  She should take her diuretic dose.  If her weight remains in the 152 range this is good.  If her weight slowly climbs and continues to go up or goes up rapidly she should back off fluids further and take an extra dose of her diuretic.  Current Medications (verified): 1)  Allopurinol 300 Mg Tabs (Allopurinol) .... Take 1 Tablet By Mouth Once A Day 2)  Lovastatin 40 Mg  Tabs (Lovastatin) .... Take 1 Tablet By Mouth Once A Day 3)  Furosemide 40 Mg Tabs (Furosemide) .... Take 1 Tablet By Mouth Once A Day 4)  One Touch Ultra Test Strips .... Check Blood Sugar Daily--250.00 5)  Tramadol Hcl 50 Mg Tabs (Tramadol Hcl) .... One Half Tablet As Needed Up  To Four Times A Day For Pain. 6)  Vitamin D 3 .... One Daily. Otc 7)  Alprazolam 0.25 Mg Tabs (Alprazolam) .... 1/2 To 1  At Bedtime As Needed Rest/sleep 8)  Icaps  Caps (Multiple Vitamins-Minerals) .... Take 1 Capsule By Mouth Once A Day 9)  Diltiazem Hcl Er Beads 120 Mg  Xr24h-Cap (Diltiazem Hcl Er Beads) .Marland Kitchen.. 1 Daily 10)  Quinapril Hcl 10 Mg Tabs (Quinapril Hcl) .... Once Daily 11)  Plavix 75 Mg Tabs (Clopidogrel Bisulfate) .... Take One Tablet By Mouth Daily 12)  Citalopram Hydrobromide 20 Mg Tabs (Citalopram Hydrobromide) .... Once Daily  Allergies (verified): 1)  ! Codeine 2)  ! * Captopril 3)  ! Zoloft  Past History:  Past Medical History: Hyperlipidemia Congestive heart failure Diabetes mellitus, type II (9.2000) Gout Osteopenia (4.2002) Cerebellar Stroke...12/20/2009...with single infarct right frontal also...possibly embolic...carotid dopplers OK.Marland KitchenMarland KitchenMarland KitchenPlan plavix indefinitely  /  event recorder done.... no atrial fibrillation..... Coumadin will not be needed... August, 2011 dysphagia   12/2009 Fall...mechanical at time of strike.Marland KitchenMarland Kitchen6/2011 EF  70%...echo..12/22/2009 /  TEE..12/2009...no clot  Review of Systems       Patient denies fever, chills, headache, sweats, rash, change in vision, change in hearing, chest pain, cough, nausea vomiting, urinary symptoms.  All other systems are reviewed and are negative  Vital Signs:  Patient profile:   77 year old female Height:      61.5 inches Weight:      156 pounds BMI:     29.10 Pulse rate:   80 / minute BP sitting:   132 / 70  (left arm) Cuff size:   regular  Vitals Entered  By: Hardin Negus, RMA (March 04, 2010 2:53 PM)  Physical Exam  General:  patient is stable today. Eyes:  no xanthelasma. Neck:  no jugular venous distention. Lungs:  lungs are clear.  Respiratory effort is nonlabored. Heart:  cardiac exam reveals S1 and S2.  No clicks or significant murmurs. Abdomen:  abdomen is soft. Extremities:  trace peripheral edema. Psych:  patient is oriented to person time and place.  Affect is normal.   Impression & Recommendations:  Problem # 1:  CHEST PAIN (ICD-786.50)  Her updated medication list for this problem includes:    Diltiazem Hcl Er Beads 120 Mg Xr24h-cap (Diltiazem hcl er  beads) .Marland Kitchen... 1 daily    Quinapril Hcl 10 Mg Tabs (Quinapril hcl) ..... Once daily    Plavix 75 Mg Tabs (Clopidogrel bisulfate) .Marland Kitchen... Take one tablet by mouth daily she is not having chest pain.  Problem # 2:  CONGESTIVE HEART FAILURE (ICD-428.0)  Her updated medication list for this problem includes:    Furosemide 40 Mg Tabs (Furosemide) .Marland Kitchen... Take 1 tablet by mouth once a day    Diltiazem Hcl Er Beads 120 Mg Xr24h-cap (Diltiazem hcl er beads) .Marland Kitchen... 1 daily    Quinapril Hcl 10 Mg Tabs (Quinapril hcl) ..... Once daily    Plavix 75 Mg Tabs (Clopidogrel bisulfate) .Marland Kitchen... Take one tablet by mouth daily Patient has diastolic heart failure.  She needs to pay careful attention to her daily weight and try to keep it in the range of 152 pounds.  I spent an extensive amount of time explaining the approach to this to the patient and her daughter.  Problem # 3:  CVA (ICD-434.91)  Her updated medication list for this problem includes:    Plavix 75 Mg Tabs (Clopidogrel bisulfate) .Marland Kitchen... Take one tablet by mouth daily Event recorder shows no evidence of atrial fibrillation.  Coumadin will be needed.

## 2010-08-21 NOTE — Progress Notes (Signed)
Summary: Rx Alprazolam  Phone Note Refill Request Call back at [phone] Message from:  [facility] on October 28, 2009 9:05 AM  Refills Requested: Medication #1:  ALPRAZOLAM 0.25 MG TABS 1/2 to 1  at bedtime as needed rest/sleep   Last Refilled: 08/22/2009 Received faxed refill request please advise.   Method Requested: Telephone to Pharmacy Initial call taken by: Linde Gillis CMA [facility] Dull),  October 28, 2009 9:06 AM  Follow-up for Phone Call        okay #[phone] x 0 Needs appt with someone since Willaim Sheng has retired Follow-up by: Cindee Salt MD,  October 28, 2009 1:32 PM  Additional Follow-up for Phone Call Additional follow up Details #1::        Rx cancelled, patient now seeing Dr. Laneta Simmers, per her POA Shiela Mayer. Additional Follow-up by: Mervin Hack CMA [facility]ull),  October 28, 2009 2:17 PM

## 2010-08-21 NOTE — Procedures (Signed)
Summary: Summary Report  Summary Report   Imported By: Erle Crocker 03/07/2010 11:55:40  _____________________________________________________________________  External Attachment:    Type:   Image     Comment:   External Document

## 2010-09-29 LAB — GLUCOSE, CAPILLARY
Glucose-Capillary: 121 mg/dL — ABNORMAL HIGH (ref 70–99)
Glucose-Capillary: 130 mg/dL — ABNORMAL HIGH (ref 70–99)
Glucose-Capillary: 138 mg/dL — ABNORMAL HIGH (ref 70–99)
Glucose-Capillary: 158 mg/dL — ABNORMAL HIGH (ref 70–99)
Glucose-Capillary: 191 mg/dL — ABNORMAL HIGH (ref 70–99)
Glucose-Capillary: 99 mg/dL (ref 70–99)

## 2010-09-29 LAB — BASIC METABOLIC PANEL
BUN: 18 mg/dL (ref 6–23)
BUN: 20 mg/dL (ref 6–23)
BUN: 23 mg/dL (ref 6–23)
BUN: 31 mg/dL — ABNORMAL HIGH (ref 6–23)
BUN: 31 mg/dL — ABNORMAL HIGH (ref 6–23)
CO2: 29 mEq/L (ref 19–32)
Calcium: 8.6 mg/dL (ref 8.4–10.5)
Chloride: 100 mEq/L (ref 96–112)
Chloride: 103 mEq/L (ref 96–112)
Chloride: 105 mEq/L (ref 96–112)
Chloride: 106 mEq/L (ref 96–112)
Creatinine, Ser: 1.1 mg/dL (ref 0.4–1.2)
Creatinine, Ser: 1.14 mg/dL (ref 0.4–1.2)
Creatinine, Ser: 1.15 mg/dL (ref 0.4–1.2)
Creatinine, Ser: 1.28 mg/dL — ABNORMAL HIGH (ref 0.4–1.2)
GFR calc Af Amer: 54 mL/min — ABNORMAL LOW (ref >60)
GFR calc Af Amer: 57 mL/min — ABNORMAL LOW (ref >60)
GFR calc non Af Amer: 39 mL/min — ABNORMAL LOW (ref >60)
GFR calc non Af Amer: 44 mL/min — ABNORMAL LOW (ref >60)
GFR calc non Af Amer: 45 mL/min — ABNORMAL LOW (ref >60)
Glucose, Bld: 100 mg/dL — ABNORMAL HIGH (ref 70–99)
Glucose, Bld: 102 mg/dL — ABNORMAL HIGH (ref 70–99)
Glucose, Bld: 203 mg/dL — ABNORMAL HIGH (ref 70–99)
Potassium: 3.4 mEq/L — ABNORMAL LOW (ref 3.5–5.1)
Potassium: 3.7 mEq/L (ref 3.5–5.1)
Potassium: 3.8 mEq/L (ref 3.5–5.1)
Potassium: 4 mEq/L (ref 3.5–5.1)
Potassium: 4.6 mEq/L (ref 3.5–5.1)
Sodium: 141 mEq/L (ref 135–145)

## 2010-09-29 LAB — POCT CARDIAC MARKERS
CKMB, poc: <1 ng/mL — ABNORMAL LOW (ref 1.0–8.0)
CKMB, poc: <1 ng/mL — ABNORMAL LOW (ref 1.0–8.0)
Myoglobin, poc: 116 ng/mL (ref 12–200)
Myoglobin, poc: 136 ng/mL (ref 12–200)
Troponin i, poc: <0.05 ng/mL (ref 0.00–0.09)

## 2010-09-29 LAB — DIFFERENTIAL
Basophils Absolute: 0 10*3/uL (ref 0.0–0.1)
Basophils Relative: 0 % (ref 0–1)
Eosinophils Absolute: 0 10*3/uL (ref 0.0–0.7)
Eosinophils Relative: 0 % (ref 0–5)
Monocytes Absolute: 0 10*3/uL — ABNORMAL LOW (ref 0.1–1.0)

## 2010-09-29 LAB — BRAIN NATRIURETIC PEPTIDE
Pro B Natriuretic peptide (BNP): 246 pg/mL — ABNORMAL HIGH (ref 0.0–100.0)
Pro B Natriuretic peptide (BNP): 73.2 pg/mL (ref 0.0–100.0)

## 2010-09-29 LAB — CBC
HCT: 36.6 % (ref 36.0–46.0)
HCT: 37.8 % (ref 36.0–46.0)
HCT: 38.8 % (ref 36.0–46.0)
Hemoglobin: 12.3 g/dL (ref 12.0–15.0)
Hemoglobin: 12.7 g/dL (ref 12.0–15.0)
MCH: 32.4 pg (ref 26.0–34.0)
MCHC: 32.7 g/dL (ref 30.0–36.0)
MCV: 98.4 fL (ref 78.0–100.0)
MCV: 99 fL (ref 78.0–100.0)
MCV: 99.2 fL (ref 78.0–100.0)
Platelets: 165 10*3/uL (ref 150–400)
Platelets: 175 10*3/uL (ref 150–400)
RBC: 3.79 MIL/uL — ABNORMAL LOW (ref 3.87–5.11)
RBC: 3.98 MIL/uL (ref 3.87–5.11)
RDW: 14.5 % (ref 11.5–15.5)
RDW: 14.7 % (ref 11.5–15.5)
RDW: 14.7 % (ref 11.5–15.5)
WBC: 12.8 10*3/uL — ABNORMAL HIGH (ref 4.0–10.5)
WBC: 13 10*3/uL — ABNORMAL HIGH (ref 4.0–10.5)
WBC: 16.5 10*3/uL — ABNORMAL HIGH (ref 4.0–10.5)
WBC: 21.5 10*3/uL — ABNORMAL HIGH (ref 4.0–10.5)

## 2010-10-06 LAB — CBC
HCT: 37.2 % (ref 36.0–46.0)
Hemoglobin: 11.9 g/dL — ABNORMAL LOW (ref 12.0–15.0)
Hemoglobin: 12.5 g/dL (ref 12.0–15.0)
Hemoglobin: 13.2 g/dL (ref 12.0–15.0)
MCHC: 33.4 g/dL (ref 30.0–36.0)
MCHC: 33.7 g/dL (ref 30.0–36.0)
MCV: 97.7 fL (ref 78.0–100.0)
MCV: 97.8 fL (ref 78.0–100.0)
MCV: 99.1 fL (ref 78.0–100.0)
Platelets: 192 10*3/uL (ref 150–400)
RBC: 3.65 MIL/uL — ABNORMAL LOW (ref 3.87–5.11)
RBC: 4.05 MIL/uL (ref 3.87–5.11)
RDW: 15.6 % — ABNORMAL HIGH (ref 11.5–15.5)
RDW: 15.8 % — ABNORMAL HIGH (ref 11.5–15.5)
WBC: 11 10*3/uL — ABNORMAL HIGH (ref 4.0–10.5)
WBC: 12.6 10*3/uL — ABNORMAL HIGH (ref 4.0–10.5)

## 2010-10-06 LAB — DIFFERENTIAL
Eosinophils Relative: 2 % (ref 0–5)
Lymphocytes Relative: 12 % (ref 12–46)
Lymphs Abs: 1.6 10*3/uL (ref 0.7–4.0)
Monocytes Absolute: 0.4 10*3/uL (ref 0.1–1.0)

## 2010-10-06 LAB — BASIC METABOLIC PANEL
BUN: 9 mg/dL (ref 6–23)
CO2: 26 mEq/L (ref 19–32)
Chloride: 104 mEq/L (ref 96–112)
Chloride: 105 mEq/L (ref 96–112)
Chloride: 110 mEq/L (ref 96–112)
Creatinine, Ser: 0.89 mg/dL (ref 0.4–1.2)
GFR calc Af Amer: >60 mL/min (ref >60)
GFR calc Af Amer: >60 mL/min (ref >60)
GFR calc non Af Amer: 51 mL/min — ABNORMAL LOW (ref >60)
Glucose, Bld: 105 mg/dL — ABNORMAL HIGH (ref 70–99)
Potassium: 3.8 mEq/L (ref 3.5–5.1)
Potassium: 3.8 mEq/L (ref 3.5–5.1)
Potassium: 4.3 mEq/L (ref 3.5–5.1)
Sodium: 137 mEq/L (ref 135–145)
Sodium: 143 mEq/L (ref 135–145)

## 2010-10-06 LAB — COMPREHENSIVE METABOLIC PANEL
AST: 21 U/L (ref 0–37)
Albumin: 3.5 g/dL (ref 3.5–5.2)
Calcium: 9 mg/dL (ref 8.4–10.5)
Creatinine, Ser: 1.14 mg/dL (ref 0.4–1.2)
GFR calc Af Amer: 54 mL/min — ABNORMAL LOW (ref >60)
GFR calc non Af Amer: 45 mL/min — ABNORMAL LOW (ref >60)

## 2010-10-06 LAB — HEMOGLOBIN A1C
Hgb A1c MFr Bld: 6.6 % — ABNORMAL HIGH (ref <5.7)
Mean Plasma Glucose: 143 mg/dL — ABNORMAL HIGH (ref <117)

## 2010-10-06 LAB — CARDIAC PANEL(CRET KIN+CKTOT+MB+TROPI)
CK, MB: 1.2 ng/mL (ref 0.3–4.0)
CK, MB: 1.6 ng/mL (ref 0.3–4.0)
Relative Index: INVALID (ref 0.0–2.5)
Relative Index: INVALID (ref 0.0–2.5)
Relative Index: INVALID (ref 0.0–2.5)
Total CK: 51 U/L (ref 7–177)
Troponin I: 0.01 ng/mL (ref 0.00–0.06)

## 2010-10-06 LAB — CULTURE, BLOOD (SINGLE): Culture: NO GROWTH

## 2010-10-06 LAB — URINALYSIS, ROUTINE W REFLEX MICROSCOPIC
Bilirubin Urine: NEGATIVE
Nitrite: NEGATIVE
Specific Gravity, Urine: 1.008 (ref 1.005–1.030)
pH: 6.5 (ref 5.0–8.0)

## 2010-10-06 LAB — LIPID PANEL
Cholesterol: 134 mg/dL (ref 0–200)
Total CHOL/HDL Ratio: 3.5 RATIO

## 2010-10-06 LAB — GLUCOSE, CAPILLARY
Glucose-Capillary: 102 mg/dL — ABNORMAL HIGH (ref 70–99)
Glucose-Capillary: 102 mg/dL — ABNORMAL HIGH (ref 70–99)
Glucose-Capillary: 115 mg/dL — ABNORMAL HIGH (ref 70–99)
Glucose-Capillary: 135 mg/dL — ABNORMAL HIGH (ref 70–99)
Glucose-Capillary: 137 mg/dL — ABNORMAL HIGH (ref 70–99)
Glucose-Capillary: 236 mg/dL — ABNORMAL HIGH (ref 70–99)
Glucose-Capillary: 97 mg/dL (ref 70–99)

## 2010-10-06 LAB — CK TOTAL AND CKMB (NOT AT ARMC)
CK, MB: 1.5 ng/mL (ref 0.3–4.0)
Relative Index: INVALID (ref 0.0–2.5)
Total CK: 57 U/L (ref 7–177)

## 2010-10-06 LAB — TROPONIN I: Troponin I: 0.01 ng/mL (ref 0.00–0.06)

## 2010-10-06 LAB — URINE MICROSCOPIC-ADD ON

## 2010-10-06 LAB — URINE CULTURE

## 2010-10-08 LAB — POCT I-STAT, CHEM 8
HCT: 39 % (ref 36.0–46.0)
Hemoglobin: 13.3 g/dL (ref 12.0–15.0)
Potassium: 4 mEq/L (ref 3.5–5.1)
Sodium: 134 mEq/L — ABNORMAL LOW (ref 135–145)

## 2010-10-08 LAB — CBC
HCT: 36.8 % (ref 36.0–46.0)
HCT: 40.3 % (ref 36.0–46.0)
Hemoglobin: 13.7 g/dL (ref 12.0–15.0)
MCHC: 33.8 g/dL (ref 30.0–36.0)
MCHC: 33.9 g/dL (ref 30.0–36.0)
Platelets: 206 10*3/uL (ref 150–400)
Platelets: 209 10*3/uL (ref 150–400)
RBC: 3.88 MIL/uL (ref 3.87–5.11)
RDW: 14.9 % (ref 11.5–15.5)
RDW: 14.9 % (ref 11.5–15.5)
RDW: 15.3 % (ref 11.5–15.5)
WBC: 11.7 10*3/uL — ABNORMAL HIGH (ref 4.0–10.5)

## 2010-10-08 LAB — DIFFERENTIAL
Basophils Absolute: 0 10*3/uL (ref 0.0–0.1)
Basophils Relative: 0 % (ref 0–1)
Basophils Relative: 0 % (ref 0–1)
Eosinophils Relative: 3 % (ref 0–5)
Lymphocytes Relative: 16 % (ref 12–46)
Monocytes Absolute: 0.4 10*3/uL (ref 0.1–1.0)
Monocytes Relative: 3 % (ref 3–12)
Monocytes Relative: 6 % (ref 3–12)
Neutro Abs: 12.5 10*3/uL — ABNORMAL HIGH (ref 1.7–7.7)
Neutro Abs: 9 10*3/uL — ABNORMAL HIGH (ref 1.7–7.7)
Neutrophils Relative %: 79 % — ABNORMAL HIGH (ref 43–77)

## 2010-10-08 LAB — COMPREHENSIVE METABOLIC PANEL
AST: 17 U/L (ref 0–37)
Albumin: 2.9 g/dL — ABNORMAL LOW (ref 3.5–5.2)
Albumin: 3 g/dL — ABNORMAL LOW (ref 3.5–5.2)
Alkaline Phosphatase: 67 U/L (ref 39–117)
Alkaline Phosphatase: 74 U/L (ref 39–117)
BUN: 13 mg/dL (ref 6–23)
BUN: 15 mg/dL (ref 6–23)
Calcium: 9.4 mg/dL (ref 8.4–10.5)
Creatinine, Ser: 0.91 mg/dL (ref 0.4–1.2)
GFR calc Af Amer: >60 mL/min (ref >60)
Glucose, Bld: 111 mg/dL — ABNORMAL HIGH (ref 70–99)
Potassium: 3.6 mEq/L (ref 3.5–5.1)
Total Protein: 6.3 g/dL (ref 6.0–8.3)
Total Protein: 6.7 g/dL (ref 6.0–8.3)

## 2010-10-08 LAB — LEGIONELLA ANTIGEN, URINE: Legionella Antigen, Urine: NEGATIVE

## 2010-10-08 LAB — GLUCOSE, CAPILLARY
Glucose-Capillary: 113 mg/dL — ABNORMAL HIGH (ref 70–99)
Glucose-Capillary: 114 mg/dL — ABNORMAL HIGH (ref 70–99)
Glucose-Capillary: 127 mg/dL — ABNORMAL HIGH (ref 70–99)
Glucose-Capillary: 138 mg/dL — ABNORMAL HIGH (ref 70–99)
Glucose-Capillary: 153 mg/dL — ABNORMAL HIGH (ref 70–99)
Glucose-Capillary: 204 mg/dL — ABNORMAL HIGH (ref 70–99)
Glucose-Capillary: 87 mg/dL (ref 70–99)
Glucose-Capillary: 91 mg/dL (ref 70–99)
Glucose-Capillary: 97 mg/dL (ref 70–99)

## 2010-10-08 LAB — CARDIAC PANEL(CRET KIN+CKTOT+MB+TROPI)
Relative Index: INVALID (ref 0.0–2.5)
Relative Index: INVALID (ref 0.0–2.5)
Total CK: 26 U/L (ref 7–177)
Total CK: 40 U/L (ref 7–177)
Troponin I: 0.03 ng/mL (ref 0.00–0.06)

## 2010-10-08 LAB — CK TOTAL AND CKMB (NOT AT ARMC)
Relative Index: INVALID (ref 0.0–2.5)
Total CK: 33 U/L (ref 7–177)

## 2010-10-08 LAB — PROTIME-INR: INR: 1.07 (ref 0.00–1.49)

## 2010-10-08 LAB — TROPONIN I: Troponin I: 0.04 ng/mL (ref 0.00–0.06)

## 2010-10-08 LAB — BRAIN NATRIURETIC PEPTIDE: Pro B Natriuretic peptide (BNP): 96 pg/mL (ref 0.0–100.0)

## 2010-10-08 LAB — HEMOGLOBIN A1C: Hgb A1c MFr Bld: 6.6 % — ABNORMAL HIGH (ref 4.6–6.1)

## 2010-10-08 LAB — APTT: aPTT: 26 seconds (ref 24–37)

## 2010-10-23 LAB — DIFFERENTIAL
Basophils Absolute: 0 10*3/uL (ref 0.0–0.1)
Basophils Relative: 0 % (ref 0–1)
Basophils Relative: 0 % (ref 0–1)
Basophils Relative: 0 % (ref 0–1)
Eosinophils Absolute: 0.4 10*3/uL (ref 0.0–0.7)
Eosinophils Absolute: 0.5 10*3/uL (ref 0.0–0.7)
Eosinophils Relative: 4 % (ref 0–5)
Eosinophils Relative: 4 % (ref 0–5)
Eosinophils Relative: 4 % (ref 0–5)
Lymphocytes Relative: 20 % (ref 12–46)
Monocytes Absolute: 0.4 10*3/uL (ref 0.1–1.0)
Monocytes Absolute: 0.5 10*3/uL (ref 0.1–1.0)
Monocytes Relative: 4 % (ref 3–12)
Monocytes Relative: 4 % (ref 3–12)

## 2010-10-23 LAB — URINE CULTURE

## 2010-10-23 LAB — CBC
HCT: 37.5 % (ref 36.0–46.0)
HCT: 38.6 % (ref 36.0–46.0)
HCT: 42 % (ref 36.0–46.0)
Hemoglobin: 12.6 g/dL (ref 12.0–15.0)
Hemoglobin: 13.6 g/dL (ref 12.0–15.0)
MCHC: 32.4 g/dL (ref 30.0–36.0)
MCHC: 33.7 g/dL (ref 30.0–36.0)
MCV: 99.4 fL (ref 78.0–100.0)
MCV: 99.5 fL (ref 78.0–100.0)
Platelets: 196 10*3/uL (ref 150–400)
RBC: 3.77 MIL/uL — ABNORMAL LOW (ref 3.87–5.11)
RBC: 4.22 MIL/uL (ref 3.87–5.11)
RDW: 15 % (ref 11.5–15.5)

## 2010-10-23 LAB — CK TOTAL AND CKMB (NOT AT ARMC)
CK, MB: 1.5 ng/mL (ref 0.3–4.0)
Total CK: 31 U/L (ref 7–177)

## 2010-10-23 LAB — GLUCOSE, CAPILLARY
Glucose-Capillary: 106 mg/dL — ABNORMAL HIGH (ref 70–99)
Glucose-Capillary: 114 mg/dL — ABNORMAL HIGH (ref 70–99)
Glucose-Capillary: 128 mg/dL — ABNORMAL HIGH (ref 70–99)
Glucose-Capillary: 132 mg/dL — ABNORMAL HIGH (ref 70–99)

## 2010-10-23 LAB — BASIC METABOLIC PANEL
BUN: 26 mg/dL — ABNORMAL HIGH (ref 6–23)
CO2: 20 mEq/L (ref 19–32)
CO2: 21 mEq/L (ref 19–32)
Calcium: 8.9 mg/dL (ref 8.4–10.5)
Chloride: 105 mEq/L (ref 96–112)
Chloride: 107 mEq/L (ref 96–112)
Creatinine, Ser: 1.01 mg/dL (ref 0.4–1.2)
GFR calc Af Amer: 43 mL/min — ABNORMAL LOW (ref >60)
GFR calc Af Amer: >60 mL/min (ref >60)
GFR calc non Af Amer: 52 mL/min — ABNORMAL LOW (ref >60)
Glucose, Bld: 112 mg/dL — ABNORMAL HIGH (ref 70–99)
Potassium: 4.2 mEq/L (ref 3.5–5.1)
Potassium: 4.3 mEq/L (ref 3.5–5.1)
Sodium: 135 mEq/L (ref 135–145)
Sodium: 137 mEq/L (ref 135–145)

## 2010-10-23 LAB — POCT CARDIAC MARKERS
CKMB, poc: 2.1 ng/mL (ref 1.0–8.0)
Troponin i, poc: <0.05 ng/mL (ref 0.00–0.09)

## 2010-10-23 LAB — URINE MICROSCOPIC-ADD ON

## 2010-10-23 LAB — URINALYSIS, ROUTINE W REFLEX MICROSCOPIC
Bilirubin Urine: NEGATIVE
Hgb urine dipstick: NEGATIVE
Specific Gravity, Urine: 1.012 (ref 1.005–1.030)
Urobilinogen, UA: 0.2 mg/dL (ref 0.0–1.0)

## 2010-10-23 LAB — TSH
TSH: 2.044 u[IU]/mL (ref 0.350–4.500)
TSH: 3.883 u[IU]/mL (ref 0.350–4.500)

## 2010-10-23 LAB — TROPONIN I: Troponin I: 0.02 ng/mL (ref 0.00–0.06)

## 2010-10-23 LAB — HEMOGLOBIN A1C: Mean Plasma Glucose: 131 mg/dL

## 2010-12-02 NOTE — Op Note (Signed)
NAMEZENAB, GRONEWOLD NO.:  [id number]   MEDICAL RECORD NO.:  [id number]          PATIENT TYPE:  AMB   LOCATION:  ENDO                         FACILITY:  [facility]   PHYSICIAN:  Georgiana Spinner, M.D.    DATE OF BIRTH:  [birth year]   DATE OF PROCEDURE:  12/08/2006  DATE OF DISCHARGE:                               OPERATIVE REPORT   PROCEDURE:  Upper endoscopy.   INDICATIONS:  Abdominal pain.   ANESTHESIA:  Fentanyl 50 mcg and Versed 4 mg.   PROCEDURE:  With the patient mildly sedated in the left lateral  decubitus position, the Pentax videoscopic endoscope was inserted in the  mouth and passed under direct vision to the esophagus which appeared  normal.  We advanced to the stomach, fundus, body, antrum, bulb and  second portion duodenum were visualized.  From this point the endoscope  was slowly withdrawn taking circumferential views of duodenal mucosa.  The endoscope was pulled back in the stomach and placed in retroflexion  views to view from above.  The endoscope was then straightened and  withdrawn taking circumferential views of remaining gastric and  esophageal mucosa.  The patient's vital signs and pulse oximeter  remained stable.  The patient tolerated the procedure well with no  apparent complications.   FINDINGS:  Negative examination.   PLAN:  Proceed to CT scan of the abdomen.           ______________________________  Georgiana Spinner, M.D.     GMO/MEDQ  D:  12/08/2006  T:  12/08/2006  Job:  [id number]

## 2010-12-28 IMAGING — CR DG RIBS W/ CHEST 3+V*R*
3 series · 3 of 3 positions shown · non-contrast
Comparison: [HOSPITAL] at [REDACTED] [HOSPITAL] lumbar spine
radiographs 01/04/2009 and [HOSPITAL] right shoulder
radiographs 04/30/2009.

CLINICAL DATA: Fall injury with right shoulder and lateral right
rib pain.

RIGHT RIBS AND CHEST - 3+ VIEW

[w chest pa]
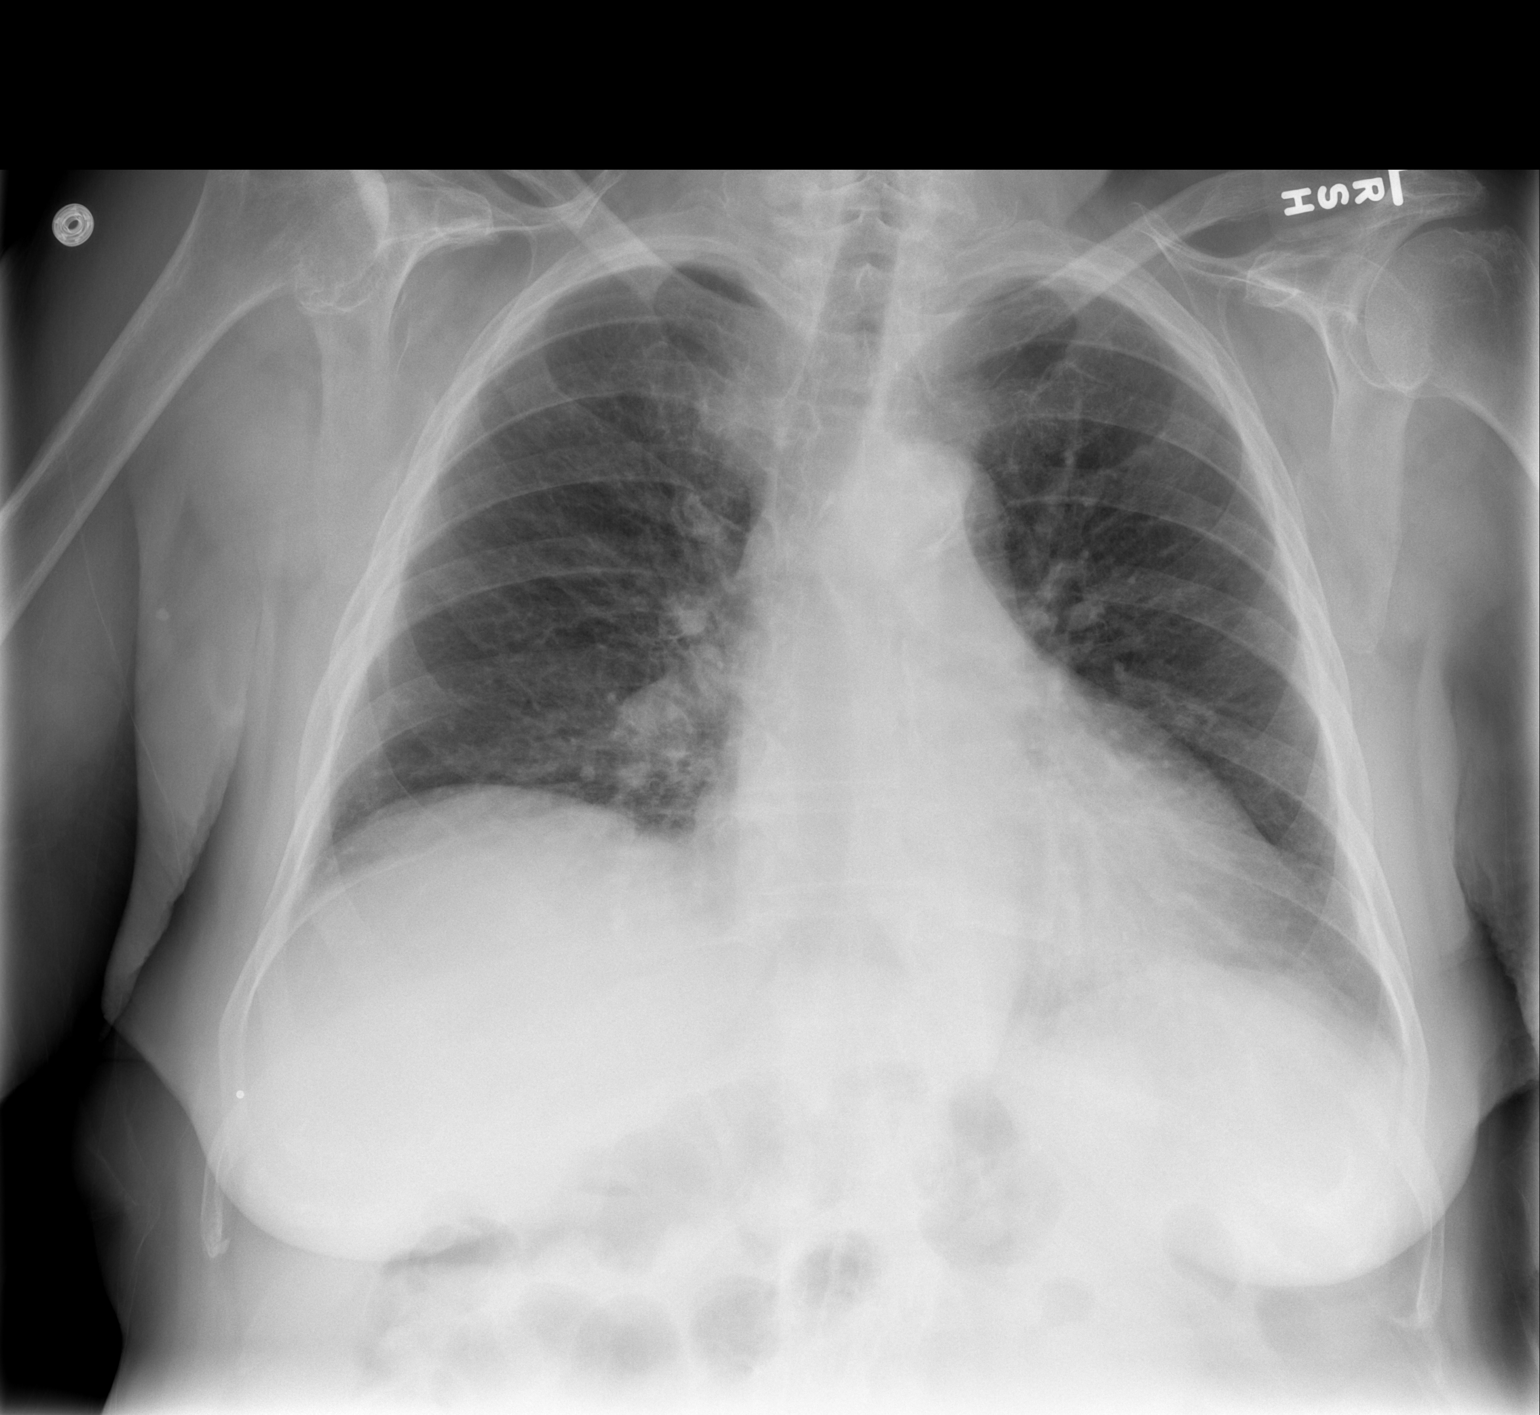

[w ribs ap/pa lower right]
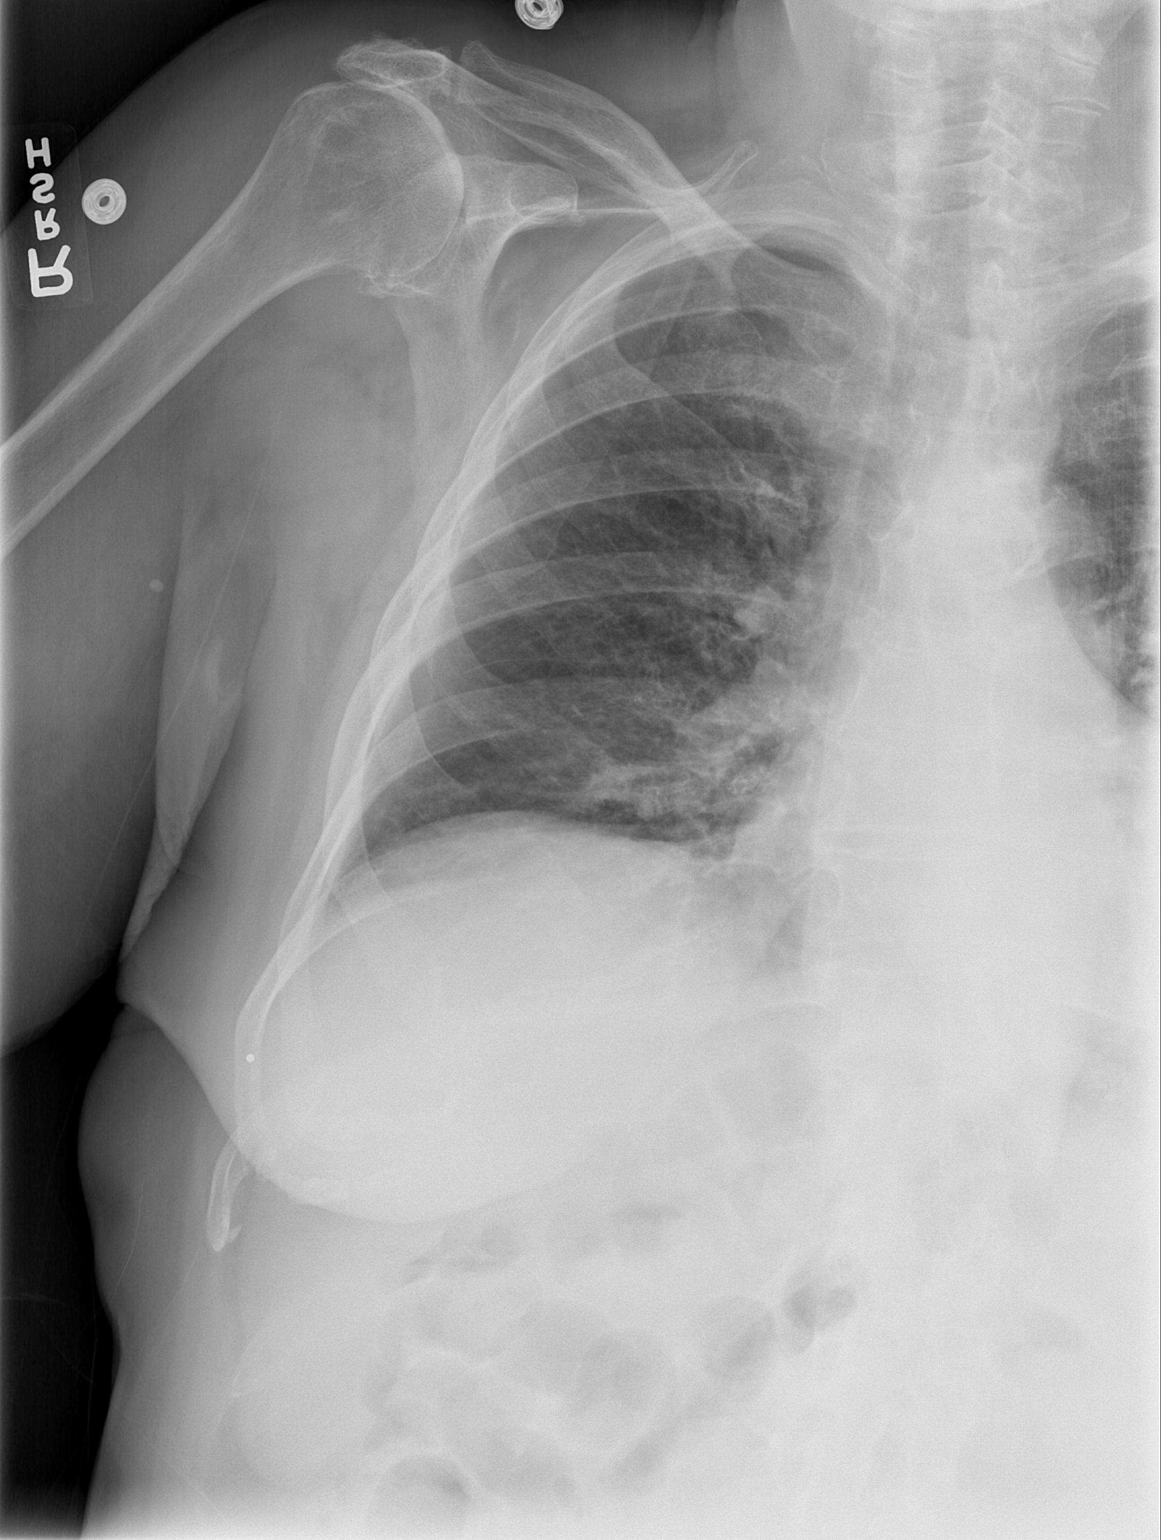

[w ribs oblique right]
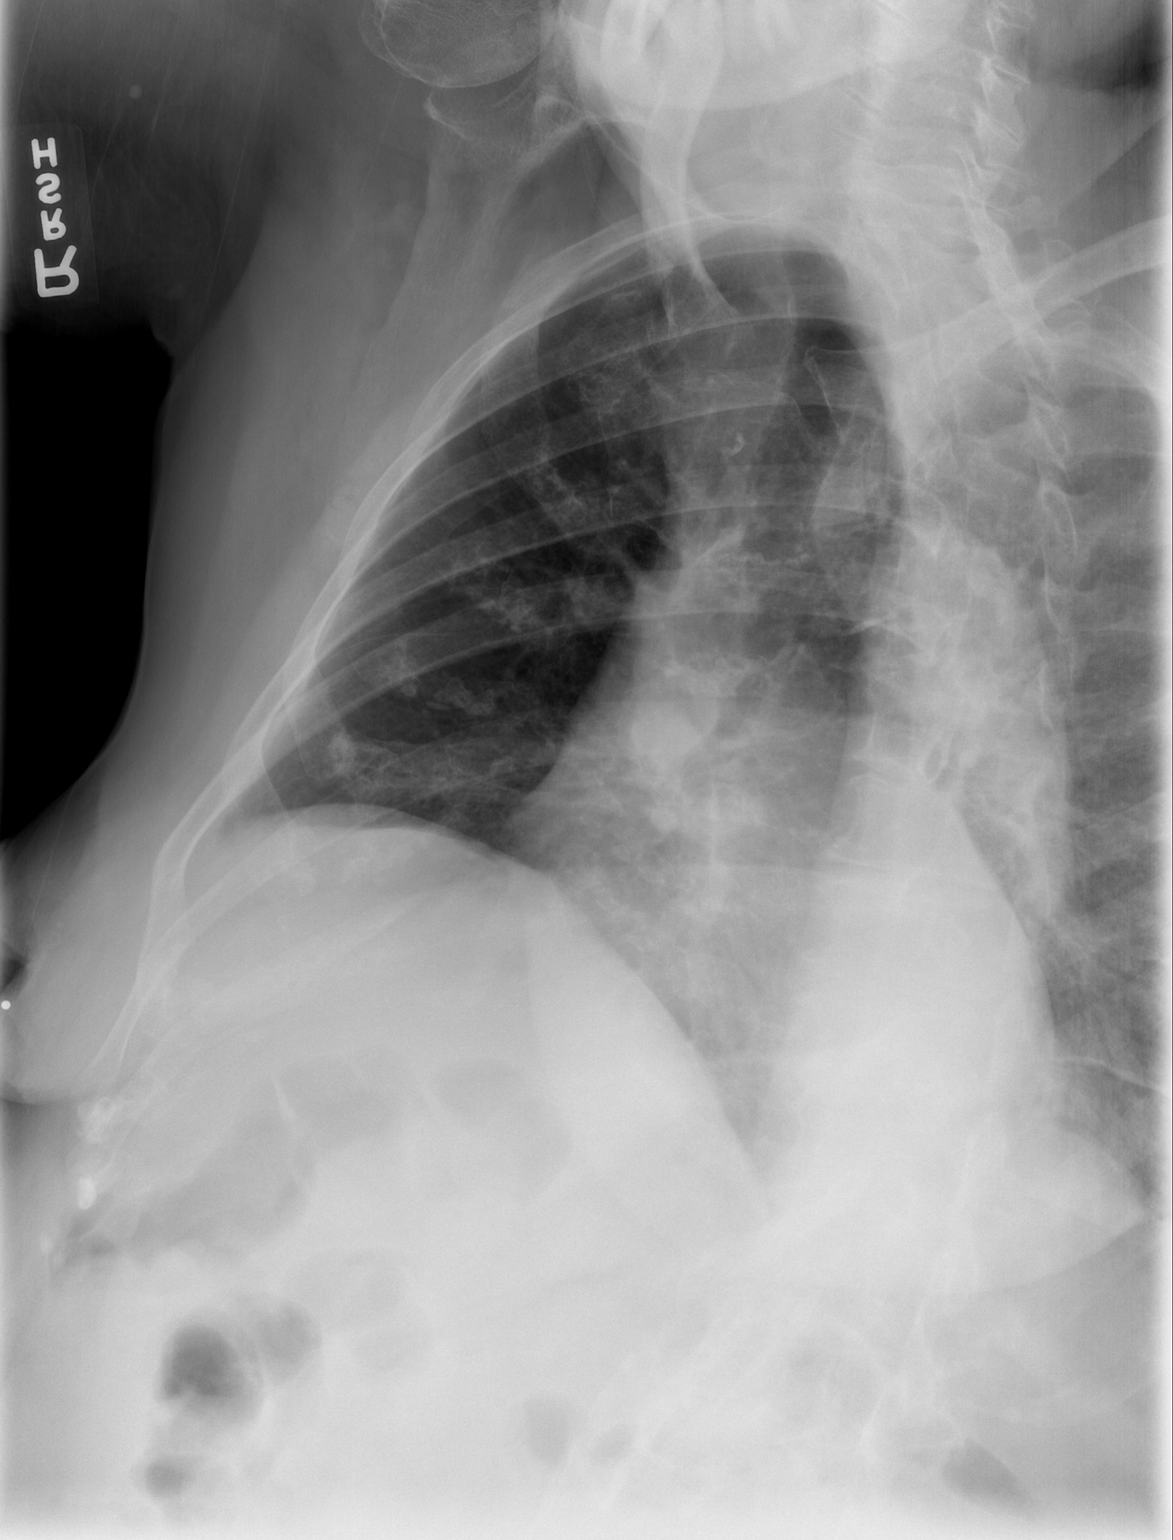

[3 of 3 positions shown; findings below may reference images not displayed]

FINDINGS: Region of maximum symptoms marked with surface BB no rib
fracture or rib lesion identified.  Submaximal inspiration is seen
with clear lungs and slight cardiomegaly.  Chronic-appearing
advanced right rotator cuff arthropathy findings noted.
Generalized atheromatous vascular calcification is seen.
IMPRESSION: 1.  No evidence for right rib fracture.
2.  Slight cardiomegaly and generalized atheromatous vascular
calcification with chronic advanced right rotator cuff arthropathy
findings.
3.  No additional acute findings.

## 2010-12-28 IMAGING — CR DG SHOULDER 2+V*R*
3 series · 3 of 3 positions shown · non-contrast
Comparison: [HOSPITAL] chest x-ray [DATE] 07/08/2009.

CLINICAL DATA: Fall injury with right shoulder pain, lateral right
rib pain.

RIGHT SHOULDER - 2+ VIEW

[w shoulder ap internal righ]
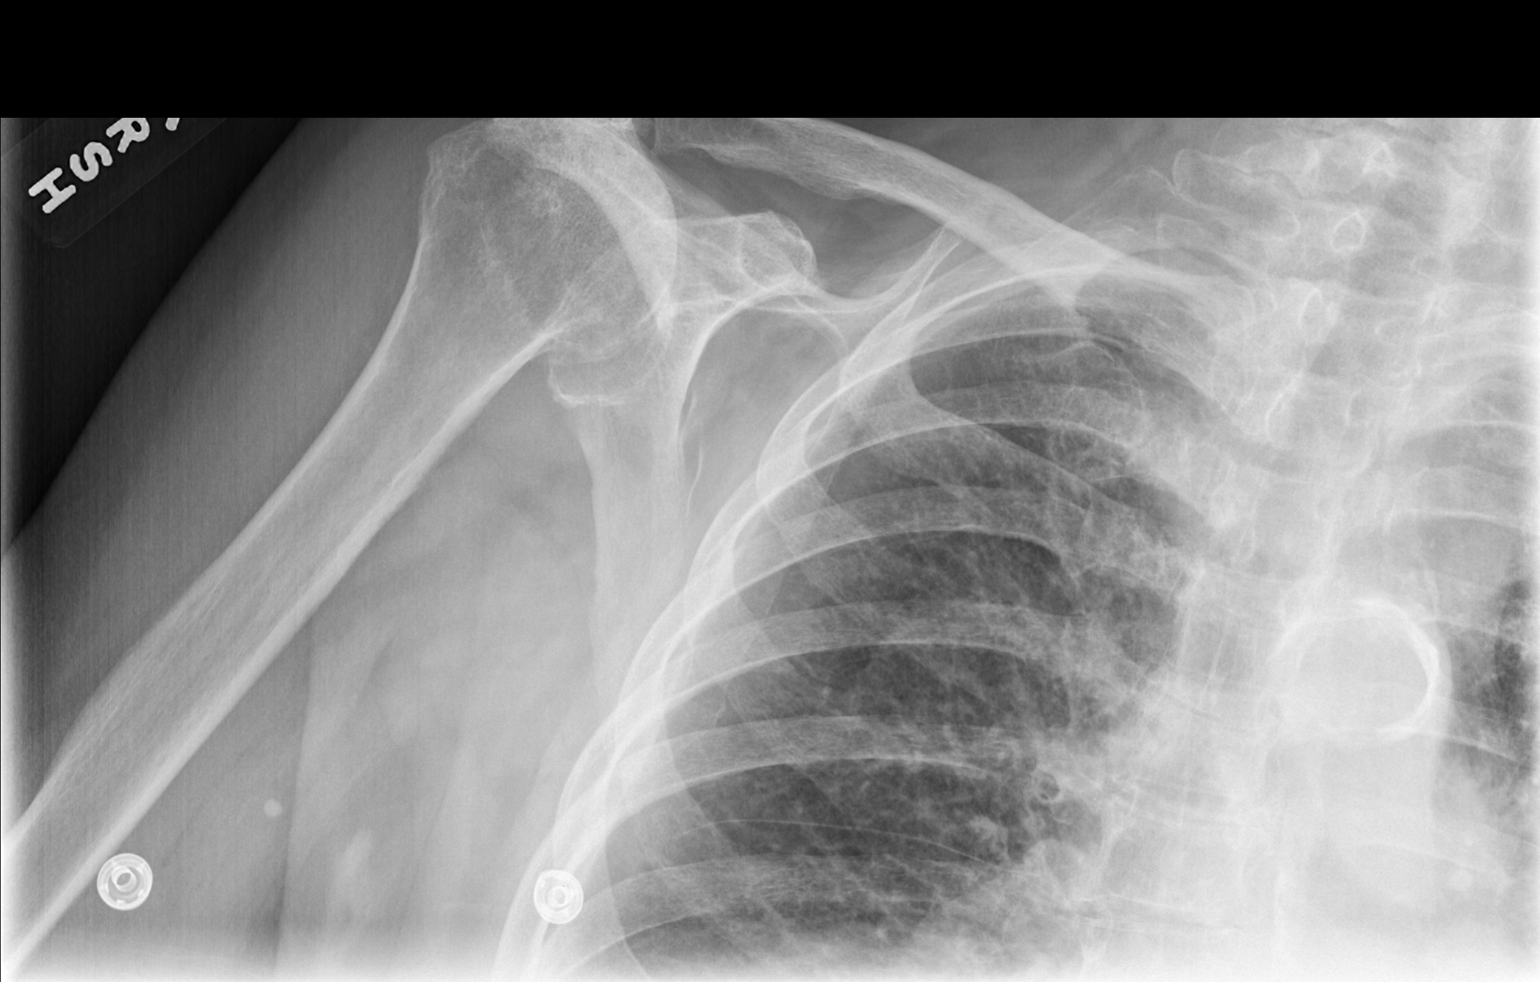

[w shoulder ap external righ]
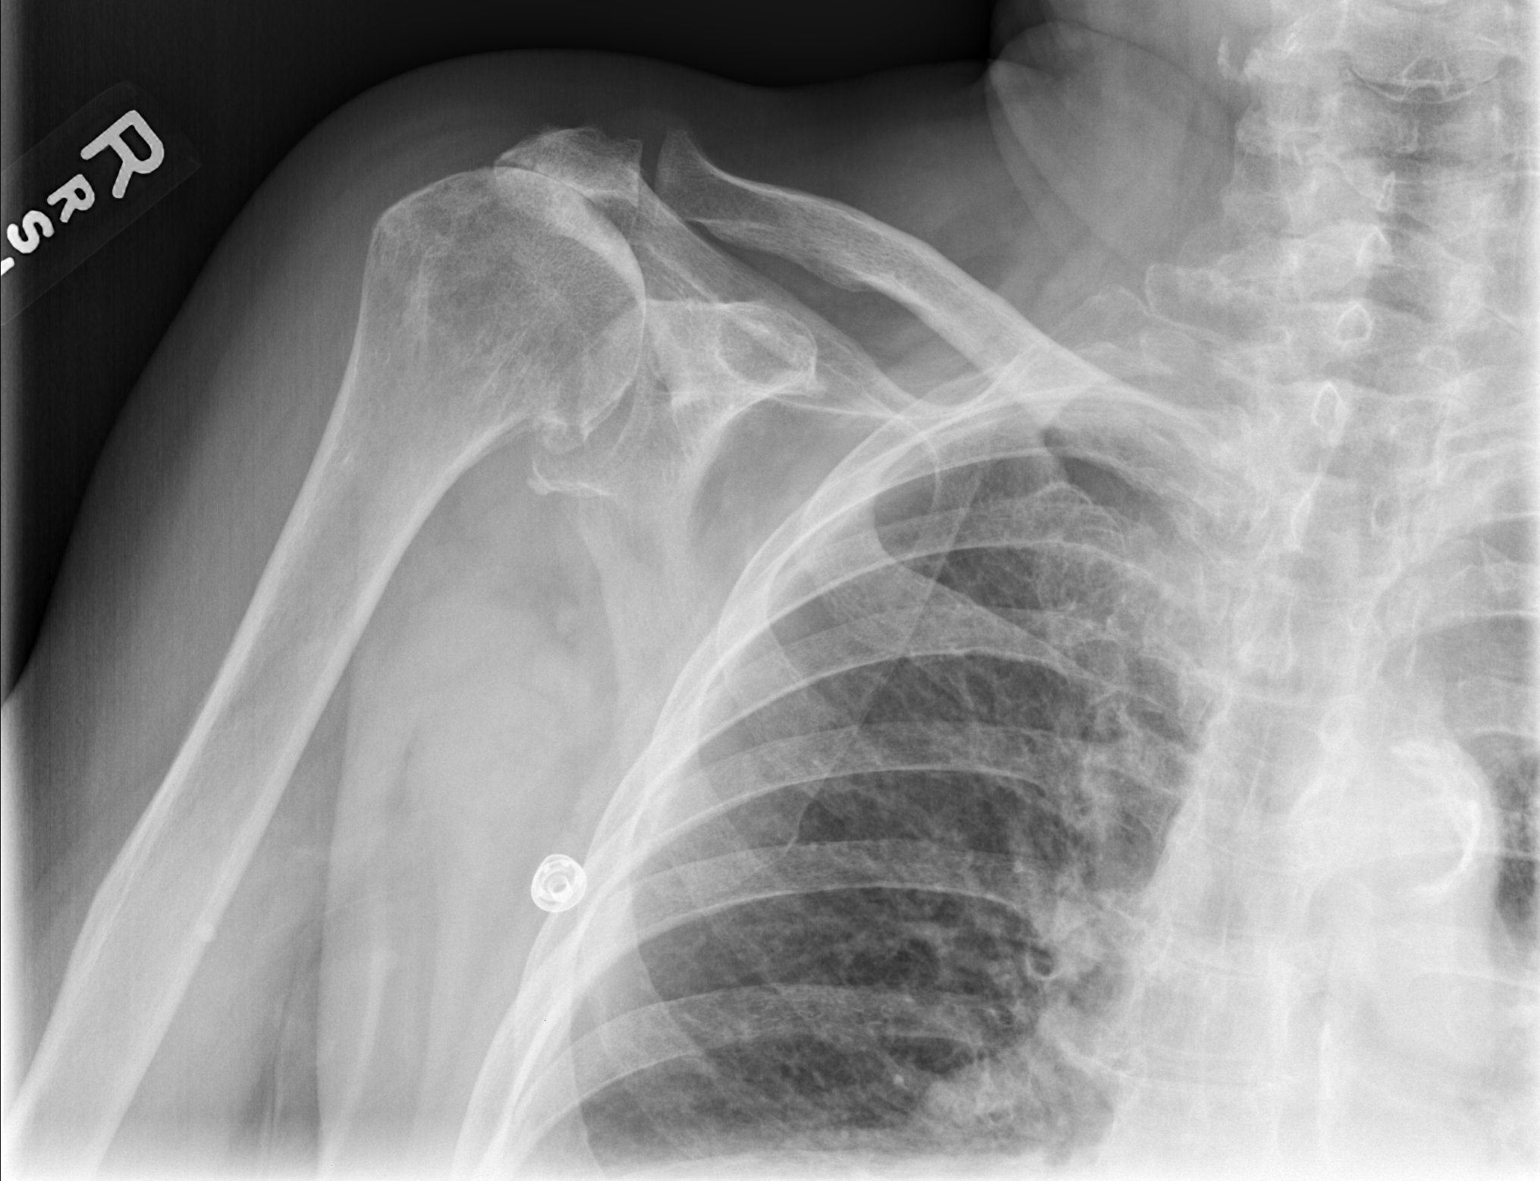

[w shoulder y view right]
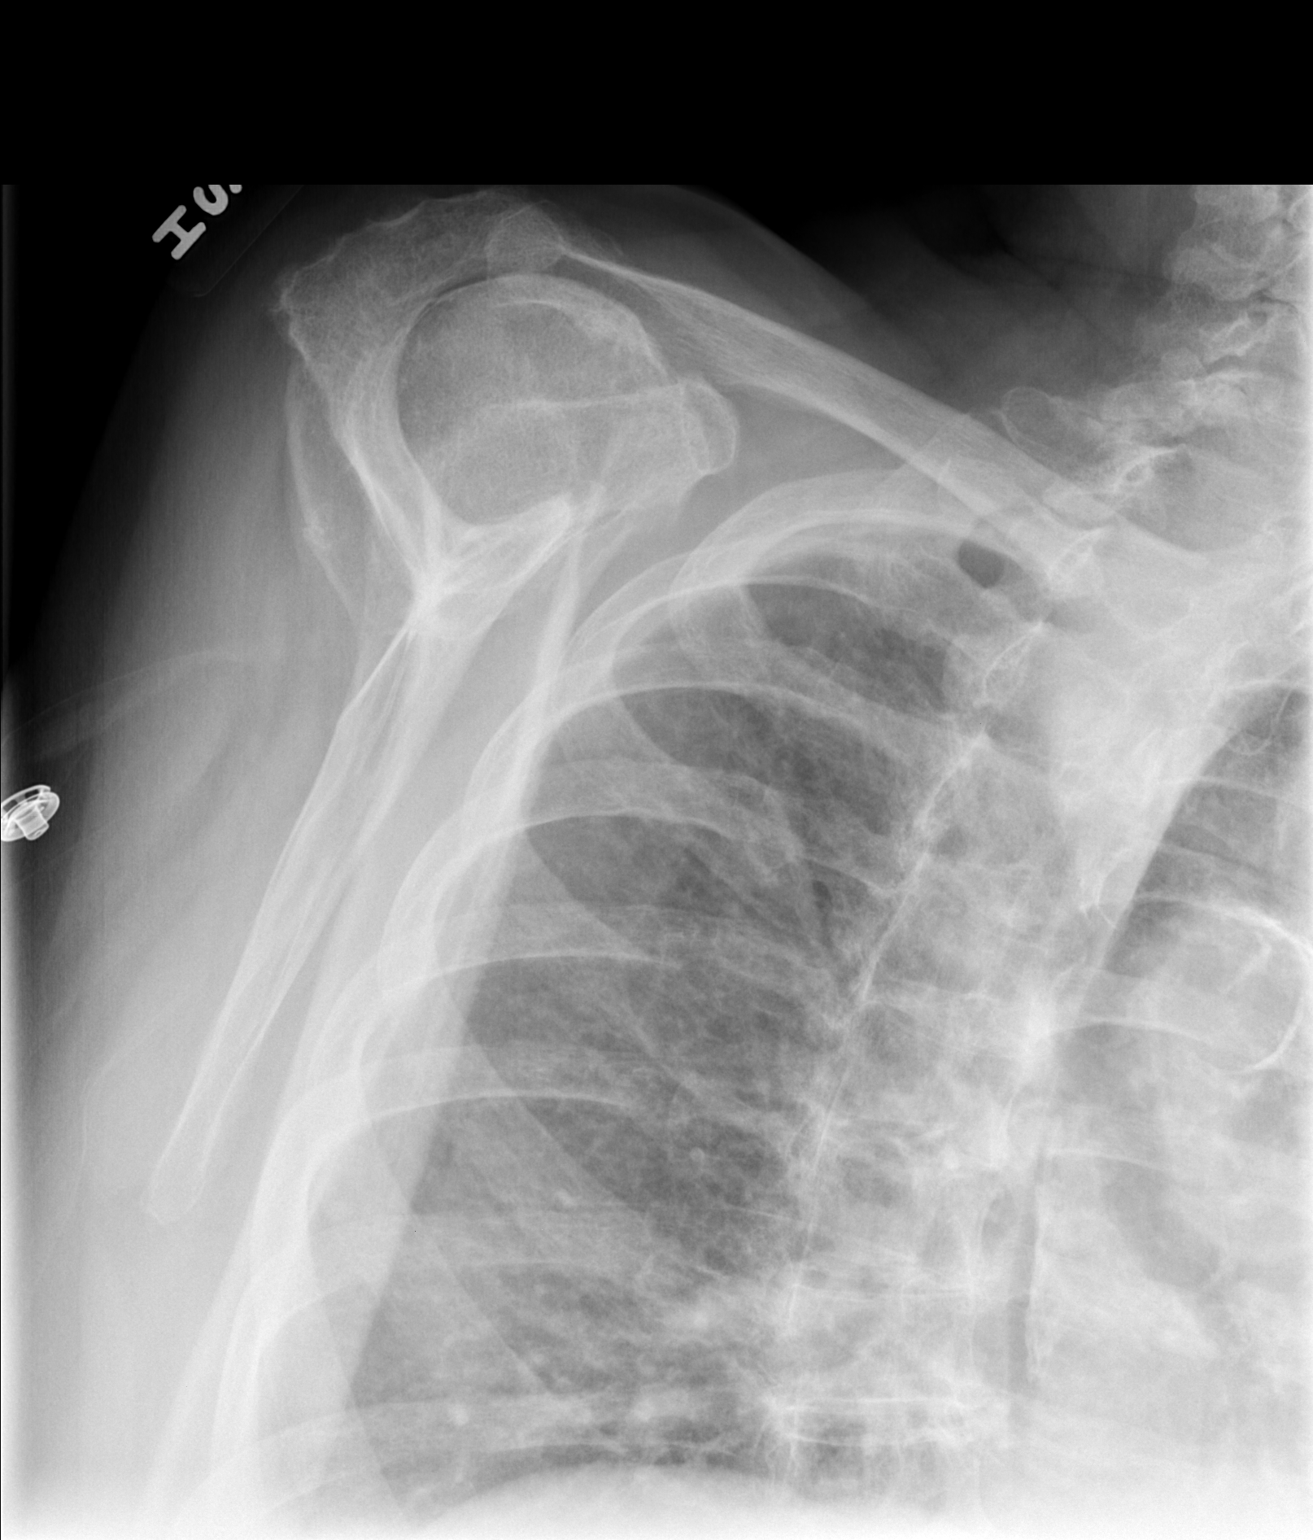

[3 of 3 positions shown; findings below may reference images not displayed]

FINDINGS: Chronic-appearing advanced right rotator cuff arthropathy
seen with bone on bone of the acromion and humeral head.  Slight
right acromioclavicular degenerative changes are seen.  No other
significant acute findings seen.
IMPRESSION: 1.  Advanced chronic right rotator cuff arthropathy findings.
2.  No acute abnormality.

## 2012-01-18 ENCOUNTER — Other Ambulatory Visit: Payer: Self-pay | Admitting: Internal Medicine

## 2012-01-18 DIAGNOSIS — R1011 Right upper quadrant pain: Secondary | ICD-10-CM

## 2012-01-19 ENCOUNTER — Other Ambulatory Visit: Payer: Self-pay | Admitting: Internal Medicine

## 2012-01-19 ENCOUNTER — Ambulatory Visit
Admission: RE | Admit: 2012-01-19 | Discharge: 2012-01-19 | Disposition: A | Discharge status: Home / Self Care | Payer: Medicare Other | Source: Ambulatory Visit | Attending: Internal Medicine | Admitting: Internal Medicine

## 2012-01-19 DIAGNOSIS — R1011 Right upper quadrant pain: Secondary | ICD-10-CM

## 2012-12-21 ENCOUNTER — Other Ambulatory Visit: Payer: Self-pay | Admitting: Internal Medicine

## 2012-12-21 ENCOUNTER — Ambulatory Visit
Admission: RE | Admit: 2012-12-21 | Discharge: 2012-12-21 | Disposition: A | Discharge status: Home / Self Care | Payer: Medicare Other | Source: Ambulatory Visit | Attending: Internal Medicine | Admitting: Internal Medicine

## 2012-12-21 DIAGNOSIS — F29 Unspecified psychosis not due to a substance or known physiological condition: Secondary | ICD-10-CM

## 2012-12-21 DIAGNOSIS — R51 Headache: Secondary | ICD-10-CM

## 2012-12-21 DIAGNOSIS — F0390 Unspecified dementia without behavioral disturbance: Secondary | ICD-10-CM

## 2013-10-16 ENCOUNTER — Encounter (HOSPITAL_COMMUNITY): Payer: Self-pay | Admitting: Emergency Medicine

## 2013-10-16 ENCOUNTER — Emergency Department (HOSPITAL_COMMUNITY): Payer: Medicare Other

## 2013-10-16 ENCOUNTER — Inpatient Hospital Stay (HOSPITAL_COMMUNITY)
Admission: EM | Admit: 2013-10-16 | Discharge: 2013-10-19 | DRG: 291 | Disposition: A | Discharge status: Hospice | Payer: Medicare Other | Attending: Internal Medicine | Admitting: Internal Medicine

## 2013-10-16 DIAGNOSIS — M25519 Pain in unspecified shoulder: Secondary | ICD-10-CM

## 2013-10-16 DIAGNOSIS — J189 Pneumonia, unspecified organism: Secondary | ICD-10-CM | POA: Diagnosis present

## 2013-10-16 DIAGNOSIS — F411 Generalized anxiety disorder: Secondary | ICD-10-CM

## 2013-10-16 DIAGNOSIS — Z7902 Long term (current) use of antithrombotics/antiplatelets: Secondary | ICD-10-CM

## 2013-10-16 DIAGNOSIS — R0603 Acute respiratory distress: Secondary | ICD-10-CM | POA: Diagnosis present

## 2013-10-16 DIAGNOSIS — T887XXA Unspecified adverse effect of drug or medicament, initial encounter: Secondary | ICD-10-CM

## 2013-10-16 DIAGNOSIS — J9600 Acute respiratory failure, unspecified whether with hypoxia or hypercapnia: Secondary | ICD-10-CM | POA: Diagnosis present

## 2013-10-16 DIAGNOSIS — R4586 Emotional lability: Secondary | ICD-10-CM | POA: Diagnosis present

## 2013-10-16 DIAGNOSIS — S92209A Fracture of unspecified tarsal bone(s) of unspecified foot, initial encounter for closed fracture: Secondary | ICD-10-CM

## 2013-10-16 DIAGNOSIS — Z66 Do not resuscitate: Secondary | ICD-10-CM | POA: Diagnosis present

## 2013-10-16 DIAGNOSIS — G9341 Metabolic encephalopathy: Secondary | ICD-10-CM | POA: Diagnosis present

## 2013-10-16 DIAGNOSIS — I69959 Hemiplegia and hemiparesis following unspecified cerebrovascular disease affecting unspecified side: Secondary | ICD-10-CM

## 2013-10-16 DIAGNOSIS — F0390 Unspecified dementia without behavioral disturbance: Secondary | ICD-10-CM | POA: Diagnosis present

## 2013-10-16 DIAGNOSIS — M109 Gout, unspecified: Secondary | ICD-10-CM

## 2013-10-16 DIAGNOSIS — K449 Diaphragmatic hernia without obstruction or gangrene: Secondary | ICD-10-CM

## 2013-10-16 DIAGNOSIS — B029 Zoster without complications: Secondary | ICD-10-CM

## 2013-10-16 DIAGNOSIS — F0391 Unspecified dementia with behavioral disturbance: Secondary | ICD-10-CM

## 2013-10-16 DIAGNOSIS — R634 Abnormal weight loss: Secondary | ICD-10-CM

## 2013-10-16 DIAGNOSIS — IMO0002 Reserved for concepts with insufficient information to code with codable children: Secondary | ICD-10-CM

## 2013-10-16 DIAGNOSIS — F329 Major depressive disorder, single episode, unspecified: Secondary | ICD-10-CM

## 2013-10-16 DIAGNOSIS — M1710 Unilateral primary osteoarthritis, unspecified knee: Secondary | ICD-10-CM

## 2013-10-16 DIAGNOSIS — I509 Heart failure, unspecified: Secondary | ICD-10-CM

## 2013-10-16 DIAGNOSIS — Z885 Allergy status to narcotic agent status: Secondary | ICD-10-CM

## 2013-10-16 DIAGNOSIS — N259 Disorder resulting from impaired renal tubular function, unspecified: Secondary | ICD-10-CM

## 2013-10-16 DIAGNOSIS — Z888 Allergy status to other drugs, medicaments and biological substances status: Secondary | ICD-10-CM

## 2013-10-16 DIAGNOSIS — Z9981 Dependence on supplemental oxygen: Secondary | ICD-10-CM

## 2013-10-16 DIAGNOSIS — I10 Essential (primary) hypertension: Secondary | ICD-10-CM

## 2013-10-16 DIAGNOSIS — I5031 Acute diastolic (congestive) heart failure: Principal | ICD-10-CM | POA: Diagnosis present

## 2013-10-16 DIAGNOSIS — F03918 Unspecified dementia, unspecified severity, with other behavioral disturbance: Secondary | ICD-10-CM

## 2013-10-16 DIAGNOSIS — I6350 Cerebral infarction due to unspecified occlusion or stenosis of unspecified cerebral artery: Secondary | ICD-10-CM

## 2013-10-16 DIAGNOSIS — F3289 Other specified depressive episodes: Secondary | ICD-10-CM

## 2013-10-16 DIAGNOSIS — R1902 Left upper quadrant abdominal swelling, mass and lump: Secondary | ICD-10-CM

## 2013-10-16 DIAGNOSIS — E785 Hyperlipidemia, unspecified: Secondary | ICD-10-CM

## 2013-10-16 DIAGNOSIS — N6019 Diffuse cystic mastopathy of unspecified breast: Secondary | ICD-10-CM

## 2013-10-16 DIAGNOSIS — Z79899 Other long term (current) drug therapy: Secondary | ICD-10-CM

## 2013-10-16 DIAGNOSIS — S92309A Fracture of unspecified metatarsal bone(s), unspecified foot, initial encounter for closed fracture: Secondary | ICD-10-CM

## 2013-10-16 DIAGNOSIS — N189 Chronic kidney disease, unspecified: Secondary | ICD-10-CM | POA: Diagnosis present

## 2013-10-16 DIAGNOSIS — N390 Urinary tract infection, site not specified: Secondary | ICD-10-CM

## 2013-10-16 DIAGNOSIS — M1990 Unspecified osteoarthritis, unspecified site: Secondary | ICD-10-CM

## 2013-10-16 DIAGNOSIS — G9340 Encephalopathy, unspecified: Secondary | ICD-10-CM

## 2013-10-16 DIAGNOSIS — Z8669 Personal history of other diseases of the nervous system and sense organs: Secondary | ICD-10-CM

## 2013-10-16 DIAGNOSIS — Z8673 Personal history of transient ischemic attack (TIA), and cerebral infarction without residual deficits: Secondary | ICD-10-CM

## 2013-10-16 DIAGNOSIS — N179 Acute kidney failure, unspecified: Secondary | ICD-10-CM | POA: Diagnosis present

## 2013-10-16 DIAGNOSIS — Z515 Encounter for palliative care: Secondary | ICD-10-CM

## 2013-10-16 DIAGNOSIS — E119 Type 2 diabetes mellitus without complications: Secondary | ICD-10-CM

## 2013-10-16 DIAGNOSIS — I129 Hypertensive chronic kidney disease with stage 1 through stage 4 chronic kidney disease, or unspecified chronic kidney disease: Secondary | ICD-10-CM | POA: Diagnosis present

## 2013-10-16 DIAGNOSIS — F341 Dysthymic disorder: Secondary | ICD-10-CM

## 2013-10-16 DIAGNOSIS — R109 Unspecified abdominal pain: Secondary | ICD-10-CM

## 2013-10-16 DIAGNOSIS — R079 Chest pain, unspecified: Secondary | ICD-10-CM

## 2013-10-16 HISTORY — DX: Cerebral infarction, unspecified: I63.9

## 2013-10-16 HISTORY — DX: Heart failure, unspecified: I50.9

## 2013-10-16 HISTORY — DX: Type 2 diabetes mellitus without complications: E11.9

## 2013-10-16 HISTORY — DX: Unspecified dementia, unspecified severity, without behavioral disturbance, psychotic disturbance, mood disturbance, and anxiety: F03.90

## 2013-10-16 LAB — CBC WITH DIFFERENTIAL/PLATELET
BASOS ABS: 0 10*3/uL (ref 0.0–0.1)
Basophils Relative: 0 % (ref 0–1)
Eosinophils Absolute: 0.3 10*3/uL (ref 0.0–0.7)
Eosinophils Relative: 3 % (ref 0–5)
HEMATOCRIT: 40.1 % (ref 36.0–46.0)
Hemoglobin: 12.6 g/dL (ref 12.0–15.0)
LYMPHS PCT: 12 % (ref 12–46)
Lymphs Abs: 1.2 10*3/uL (ref 0.7–4.0)
MCH: 32.9 pg (ref 26.0–34.0)
MCHC: 31.4 g/dL (ref 30.0–36.0)
MCV: 104.7 fL — ABNORMAL HIGH (ref 78.0–100.0)
MONO ABS: 0.5 10*3/uL (ref 0.1–1.0)
Monocytes Relative: 5 % (ref 3–12)
NEUTROS PCT: 80 % — AB (ref 43–77)
Neutro Abs: 7.9 10*3/uL — ABNORMAL HIGH (ref 1.7–7.7)
Platelets: 181 10*3/uL (ref 150–400)
RBC: 3.83 MIL/uL — ABNORMAL LOW (ref 3.87–5.11)
RDW: 14.4 % (ref 11.5–15.5)
WBC: 9.9 10*3/uL (ref 4.0–10.5)

## 2013-10-16 LAB — PRO B NATRIURETIC PEPTIDE: Pro B Natriuretic peptide (BNP): 804.7 pg/mL — ABNORMAL HIGH (ref 0–450)

## 2013-10-16 LAB — URINALYSIS, ROUTINE W REFLEX MICROSCOPIC
Bilirubin Urine: NEGATIVE
GLUCOSE, UA: NEGATIVE mg/dL
Hgb urine dipstick: NEGATIVE
KETONES UR: NEGATIVE mg/dL
Leukocytes, UA: NEGATIVE
Nitrite: NEGATIVE
PH: 5 (ref 5.0–8.0)
PROTEIN: NEGATIVE mg/dL
Specific Gravity, Urine: 1.014 (ref 1.005–1.030)
Urobilinogen, UA: 0.2 mg/dL (ref 0.0–1.0)

## 2013-10-16 LAB — COMPREHENSIVE METABOLIC PANEL
ALT: 12 U/L (ref 0–35)
AST: 18 U/L (ref 0–37)
Albumin: 3 g/dL — ABNORMAL LOW (ref 3.5–5.2)
Alkaline Phosphatase: 106 U/L (ref 39–117)
BUN: 18 mg/dL (ref 6–23)
CHLORIDE: 100 meq/L (ref 96–112)
CO2: 33 mEq/L — ABNORMAL HIGH (ref 19–32)
CREATININE: 1.19 mg/dL — AB (ref 0.50–1.10)
Calcium: 8.7 mg/dL (ref 8.4–10.5)
GFR calc Af Amer: 45 mL/min — ABNORMAL LOW (ref >90)
GFR calc non Af Amer: 38 mL/min — ABNORMAL LOW (ref >90)
Glucose, Bld: 179 mg/dL — ABNORMAL HIGH (ref 70–99)
POTASSIUM: 4.1 meq/L (ref 3.7–5.3)
SODIUM: 145 meq/L (ref 137–147)
Total Protein: 6.5 g/dL (ref 6.0–8.3)

## 2013-10-16 LAB — TROPONIN I: Troponin I: <0.3 ng/mL (ref <0.30)

## 2013-10-16 LAB — LACTIC ACID, PLASMA: Lactic Acid, Venous: 2.4 mmol/L — ABNORMAL HIGH (ref 0.5–2.2)

## 2013-10-16 MED ORDER — DEXTROSE 5 % IV SOLN
500.0000 mg | Freq: Once | INTRAVENOUS | Status: AC
  Administered 2013-10-16: 500 mg via INTRAVENOUS

## 2013-10-16 MED ORDER — DEXTROSE 5 % IV SOLN
1.0000 g | Freq: Once | INTRAVENOUS | Status: AC
  Administered 2013-10-16: 1 g via INTRAVENOUS
  Filled 2013-10-16: qty 10

## 2013-10-16 MED ORDER — INSULIN ASPART 100 UNIT/ML ~~LOC~~ SOLN
0.0000 [IU] | Freq: Three times a day (TID) | SUBCUTANEOUS | Status: DC
  Administered 2013-10-18: 2 [IU] via SUBCUTANEOUS
  Administered 2013-10-18: 1 [IU] via SUBCUTANEOUS

## 2013-10-16 NOTE — ED Notes (Signed)
Pt brought from home via EMS.  At 12 pm home healthcare RN increased home O2 to 6L (Pt normally on 2L), but pt only increased 86%.  PCP was called and pt doubled her oral dose of lasix.  Family called EMS b/c there was no improvement in oxygenation by 1845.  EMS placed pt on CPAP and sats were not improved, though rr decreased 18 and ability to speak improved.  0.4 mg sl nitro given.  22 G SL in L wrist.

## 2013-10-16 NOTE — ED Notes (Signed)
Family at bedside. 

## 2013-10-16 NOTE — ED Provider Notes (Signed)
CSN: [id number]     Arrival date & time 10/16/13  1919 History   None    Chief Complaint  Patient presents with  . Respiratory Distress     (Consider location/radiation/quality/duration/timing/severity/associated sxs/prior Treatment) HPI Patient presents to EMS in respiratory distress. Per EMS the patient has history of heart failure, has home oxygen requirement.  On today patient became more dyspneic than usual.  Patient did not improve in spite of double dose of Lasix and increase in home oxygen to 6 L per minute. Patient denies pain, fever, cough.  The patient does have a history of dementia which limits the history of present illness. This is a level V caveat. Per EMS providers patient was otherwise hemodynamically stable en route, but required noninvasive positive pressure ventilation to maintain oxygen saturation in the low 90s. No past medical history on file. No past surgical history on file. No family history on file. History  Substance Use Topics  . Smoking status: Not on file  . Smokeless tobacco: Not on file  . Alcohol Use: Not on file   OB History   No data available     Review of Systems  Unable to perform ROS: Dementia      Allergies  Captopril; Codeine; and Sertraline hcl  Home Medications  No current outpatient prescriptions on file. There were no vitals taken for this visit. Physical Exam  Nursing note and vitals reviewed. Constitutional: She appears ill.  HENT:  Head: Normocephalic and atraumatic.  Eyes: Conjunctivae and EOM are normal. Pupils are equal, round, and reactive to light.  Cardiovascular: Regular rhythm.  Tachycardia present.   Pulmonary/Chest: Accessory muscle usage present. Tachypnea noted. She is in respiratory distress. She has decreased breath sounds. She has wheezes.  Abdominal: She exhibits no distension. There is no tenderness.  Musculoskeletal: She exhibits edema.  Neurological: She is alert. No cranial nerve deficit. She  exhibits normal muscle tone. Coordination normal.  Maes, awake, alert, no facial asym. In resp distress, will require additional eval when stable.  Psychiatric: She is slowed and withdrawn. Cognition and memory are impaired.    ED Course  Procedures (including critical care time) Labs Review Labs Reviewed  CBC WITH DIFFERENTIAL - Abnormal; Notable for the following:    RBC 3.83 (*)    MCV 104.7 (*)    Neutrophils Relative % 80 (*)    Neutro Abs 7.9 (*)    All other components within normal limits  COMPREHENSIVE METABOLIC PANEL - Abnormal; Notable for the following:    CO2 33 (*)    Glucose, Bld 179 (*)    Creatinine, Ser 1.19 (*)    Albumin 3.0 (*)    Total Bilirubin <0.2 (*)    GFR calc non Af Amer 38 (*)    GFR calc Af Amer 45 (*)    All other components within normal limits  PRO B NATRIURETIC PEPTIDE - Abnormal; Notable for the following:    Pro B Natriuretic peptide (BNP) 804.7 (*)    All other components within normal limits  LACTIC ACID, PLASMA - Abnormal; Notable for the following:    Lactic Acid, Venous 2.4 (*)    All other components within normal limits  URINALYSIS, ROUTINE W REFLEX MICROSCOPIC  TROPONIN I  HEMOGLOBIN A1C   Imaging Review Dg Chest Portable 1 View  10/16/2013   CLINICAL DATA:  Respiratory distress  EXAM: PORTABLE CHEST - 1 VIEW  COMPARISON:  July 13, 2010  FINDINGS: There is stable elevation of the right  hemidiaphragm. There is consolidation throughout the left lower lobe. Lungs are otherwise clear. Heart is upper normal in size with normal pulmonary vascularity. There is atherosclerotic change in the aorta. No adenopathy. No apparent bone lesions.  IMPRESSION: Left lower lobe consolidation. Persistent elevation right hemidiaphragm.   Electronically Signed   By: Bretta BangWilliam  Woodruff M.D.   On: 10/16/2013 20:12   EKG has sinus rhythm, rate 81, PVC, artifact, nonspecific T wave changes, abnormal   After initial evaluation, intervention, patient  required BiPAP for further respiratory support.  Update: Patient's family has arrived, discussed all findings, including pneumonia with them. On exam the patient continues to require BiPAP.   Update:Patient transitioning from BiPaP. Patient has oxygen saturation 93% on facemask. Relaxer been started for pneumonia.   MDM   Final diagnoses:  Community acquired pneumonia  Respiratory distress    Patient presents in respiratory distress.  Patient has dementia, poor hearing, and complete neurologic evaluation was difficult.  Most probably, however, the patient was obviously in distress on arrival requiring noninvasive pressure support.  After resuscitation the patient did transition to facemask for support, but required supplemental oxygen throughout her course. With evidence of pneumonia she has required initiation of antibiotics. Given her respiratory distress she was admitted to the step down unit for further evaluation, management, moderate.   CRITICAL CARE Performed by: Gerhard MunchLOCKWOOD, Latash Nouri Total critical care time: 35 Critical care time was exclusive of separately billable procedures and treating other patients. Critical care was necessary to treat or prevent imminent or life-threatening deterioration. Critical care was time spent personally by me on the following activities: development of treatment plan with patient and/or surrogate as well as nursing, discussions with consultants, evaluation of patient's response to treatment, examination of patient, obtaining history from patient or surrogate, ordering and performing treatments and interventions, ordering and review of laboratory studies, ordering and review of radiographic studies, pulse oximetry and re-evaluation of patient's condition.     Gerhard Munchobert Lindzey Zent, MD 10/17/13 Marlyne Beards0002

## 2013-10-16 NOTE — H&P (Addendum)
[facility] History and Physical  Virginia SaundersMildred L Goodman ZOX:[id number] DOB: 04/16/1919 DOA: 10/16/2013  Referring physician: ED physician PCP: Tillman Abideichard Letvak, MD   Chief Complaint: shortness of breath   HPI:  Pt is 80 yo female with advanced dementia, lives at home with family, [facility] after noted to be more lethargic at home and with shortness of breath with even minimal exertion. Please note that pt is unable to provide history due to dementia and confusion. She apparently has oxygen at home but that has not seemed to have helped. There was no reported fevers, chills, abdominal or urinary concerns. Productive cough noted for the past several days with poor oral intake, no known sick contacts or exposures.   Upon arrival to ED, pt noted to be in respiratory distress with oxygen saturation in 80's, requiring BiPAP and responding well. Transitioned successfully to NRB and eventually to [place] 4 - 6 L. CXR worrisome for PNA. TRH asked to admit for further evaluation.   Assessment and Plan: Active Problems: Acute hypoxic respiratory failure - appears to be secondary to CAP - will admit to SDU for closer observation - pt currently on [place] and maintaining oxygen saturation at target range - place on Zithromax and Rocephin IV - sputum analysis ordered, strep pneumo, urine legionella, respiratory viral panel  - provide supportive care as needed Acute encephalopathy - secondary to principal problem imposed on advanced chronic dementia - management with ABX as noted above - once able to take PO, provide home medications Acute on chronic renal failure - secondary to poor oral intake - will hold Lasix temporarily and provide gentle hydration for 8 hours - repeat BMP in AM HTN - continue home medical regimen DM - place on SSI only for now   Radiological Exams on Admission: Dg Chest Portable 1 View  10/16/2013   CLINICAL DATA:  Respiratory distress  EXAM: PORTABLE CHEST - 1 VIEW   COMPARISON:  July 13, 2010  FINDINGS: There is stable elevation of the right hemidiaphragm. There is consolidation throughout the left lower lobe. Lungs are otherwise clear. Heart is upper normal in size with normal pulmonary vascularity. There is atherosclerotic change in the aorta. No adenopathy. No apparent bone lesions.  IMPRESSION: Left lower lobe consolidation. Persistent elevation right hemidiaphragm.   Electronically Signed   By: Bretta BangWilliam  Woodruff M.D.   On: 10/16/2013 20:12     Code Status: DNR Family Communication: daughter at bedside Disposition Plan: Admit for further evaluation     Review of Systems:  Unable to obtain due to dementia     Past Medical History  Diagnosis Date  . CHF (congestive heart failure)   . Stroke   . Dementia   . Diabetes mellitus without complication     Past Surgical History  Procedure Laterality Date  . Cholecystectomy      Social History:  reports that she has never smoked. She has never used smokeless tobacco. She reports that she does not drink alcohol or use illicit drugs.  Allergies  Allergen Reactions  . Captopril Other (See Comments)    REACTION: unknown  . Codeine Nausea Only  . Sertraline Hcl Nausea Only    History reviewed. No pertinent family history.  Prior to Admission medications   Medication Sig Start Date End Date Taking? Authorizing Provider  ALPRAZolam (XANAX) 0.25 MG tablet Take 0.125 mg by mouth at bedtime.   Yes Historical Provider, MD  carbamazepine (TEGRETOL) 200 MG tablet Take 200 mg by  mouth at bedtime.   Yes Historical Provider, MD  clopidogrel (PLAVIX) 75 MG tablet Take 75 mg by mouth daily with breakfast.   Yes Historical Provider, MD  diltiazem (TIAZAC) 120 MG 24 hr capsule Take 120 mg by mouth every morning.    Yes Historical Provider, MD  donepezil (ARICEPT) 10 MG tablet Take 10 mg by mouth at bedtime.   Yes Historical Provider, MD  furosemide (LASIX) 80 MG tablet Take 80 mg by mouth every morning.     Yes Historical Provider, MD  ibuprofen (ADVIL,MOTRIN) 200 MG tablet Take 400 mg by mouth daily.   Yes Historical Provider, MD  Multiple Vitamins-Minerals (PRESERVISION AREDS) TABS Take 1 tablet by mouth daily.   Yes Historical Provider, MD  potassium chloride (K-DUR) 10 MEQ tablet Take 10 mEq by mouth every morning.    Yes Historical Provider, MD    Physical Exam: Filed Vitals:   10/16/13 2015 10/16/13 2030 10/16/13 2045 10/16/13 2100  BP: 120/53 120/65 126/54 114/56  Pulse: 71 74 76 75  Temp:      TempSrc:      Resp: 22 26 28 26   SpO2: 95% 94% 94% 96%    Physical Exam  Constitutional: Appears confused but is alert, follows some commands   HENT: Normocephalic. External right and left ear normal. Dry MM Eyes: Conjunctivae and EOM are normal. PERRLA, no scleral icterus.  Neck: Normal ROM. Neck supple. No JVD. No tracheal deviation. No thyromegaly.  CVS: RRR, no gallops, no carotid bruit.  Pulmonary: Course breath sounds, rhonchi mostly on the left side, diminished air movement at bases  Abdominal: Soft. BS +,  no distension, tenderness, rebound or guarding.  Musculoskeletal:  No edema and no tenderness.  Lymphadenopathy: No lymphadenopathy noted, cervical, inguinal. Neuro: Alert. Oriented to name only, follows some commands appropriately  Skin: Skin is warm and dry. No rash noted. Not diaphoretic. No erythema. No pallor.  Psychiatric: Difficult to assess due to dementia   Labs on Admission:  Basic Metabolic Panel:  Recent Labs Lab 10/16/13 2055  NA 145  K 4.1  CL 100  CO2 33*  GLUCOSE 179*  BUN 18  CREATININE 1.19*  CALCIUM 8.7   Liver Function Tests:  Recent Labs Lab 10/16/13 2055  AST 18  ALT 12  ALKPHOS 106  BILITOT <0.2*  PROT 6.5  ALBUMIN 3.0*   CBC:  Recent Labs Lab 10/16/13 2055  WBC 9.9  NEUTROABS 7.9*  HGB 12.6  HCT 40.1  MCV 104.7*  PLT 181   Cardiac Enzymes:  Recent Labs Lab 10/16/13 2055  TROPONINI <0.30   BNP: No components  found with this basename: POCBNP,  CBG: No results found for this basename: GLUCAP,  in the last 168 hours  EKG: Normal sinus rhythm, no ST/T wave changes  Debbora Presto, MD  [facility] Pager [phone]  If 7PM-7AM, please contact night-coverage [email or web address] Password [email or web address] 10/16/2013, 11:09 PM

## 2013-10-17 DIAGNOSIS — I509 Heart failure, unspecified: Secondary | ICD-10-CM

## 2013-10-17 DIAGNOSIS — G9340 Encephalopathy, unspecified: Secondary | ICD-10-CM

## 2013-10-17 DIAGNOSIS — I10 Essential (primary) hypertension: Secondary | ICD-10-CM

## 2013-10-17 LAB — BASIC METABOLIC PANEL
BUN: 18 mg/dL (ref 6–23)
CO2: 31 meq/L (ref 19–32)
Calcium: 8.7 mg/dL (ref 8.4–10.5)
Chloride: 101 mEq/L (ref 96–112)
Creatinine, Ser: 1.11 mg/dL — ABNORMAL HIGH (ref 0.50–1.10)
GFR calc Af Amer: 48 mL/min — ABNORMAL LOW (ref >90)
GFR calc non Af Amer: 42 mL/min — ABNORMAL LOW (ref >90)
GLUCOSE: 112 mg/dL — AB (ref 70–99)
Potassium: 4.5 mEq/L (ref 3.7–5.3)
SODIUM: 142 meq/L (ref 137–147)

## 2013-10-17 LAB — CBC
HEMATOCRIT: 38.2 % (ref 36.0–46.0)
HEMOGLOBIN: 11.8 g/dL — AB (ref 12.0–15.0)
MCH: 32.8 pg (ref 26.0–34.0)
MCHC: 30.9 g/dL (ref 30.0–36.0)
MCV: 106.1 fL — ABNORMAL HIGH (ref 78.0–100.0)
Platelets: 162 10*3/uL (ref 150–400)
RBC: 3.6 MIL/uL — AB (ref 3.87–5.11)
RDW: 14.5 % (ref 11.5–15.5)
WBC: 12.8 10*3/uL — ABNORMAL HIGH (ref 4.0–10.5)

## 2013-10-17 LAB — GLUCOSE, CAPILLARY
GLUCOSE-CAPILLARY: 107 mg/dL — AB (ref 70–99)
Glucose-Capillary: 108 mg/dL — ABNORMAL HIGH (ref 70–99)
Glucose-Capillary: 110 mg/dL — ABNORMAL HIGH (ref 70–99)
Glucose-Capillary: 142 mg/dL — ABNORMAL HIGH (ref 70–99)

## 2013-10-17 LAB — STREP PNEUMONIAE URINARY ANTIGEN: Strep Pneumo Urinary Antigen: NEGATIVE

## 2013-10-17 LAB — HEMOGLOBIN A1C
HEMOGLOBIN A1C: 6.1 % — AB (ref <5.7)
Mean Plasma Glucose: 128 mg/dL — ABNORMAL HIGH (ref <117)

## 2013-10-17 LAB — INFLUENZA PANEL BY PCR (TYPE A & B)
H1N1 flu by pcr: NOT DETECTED
Influenza A By PCR: NEGATIVE
Influenza B By PCR: NEGATIVE

## 2013-10-17 LAB — LEGIONELLA ANTIGEN, URINE: Legionella Antigen, Urine: NEGATIVE

## 2013-10-17 LAB — HIV ANTIBODY (ROUTINE TESTING W REFLEX): HIV: NONREACTIVE

## 2013-10-17 LAB — MRSA PCR SCREENING: MRSA by PCR: NEGATIVE

## 2013-10-17 MED ORDER — MORPHINE SULFATE 2 MG/ML IJ SOLN: 1.0000 mg | INTRAMUSCULAR | Status: DC | PRN

## 2013-10-17 MED ORDER — CARBAMAZEPINE 200 MG PO TABS
100.0000 mg | ORAL_TABLET | Freq: Every day | ORAL | Status: DC
  Filled 2013-10-17: qty 0.5

## 2013-10-17 MED ORDER — ALPRAZOLAM 0.25 MG PO TABS
0.1250 mg | ORAL_TABLET | Freq: Every evening | ORAL | Status: DC | PRN
  Administered 2013-10-18: 0.125 mg via ORAL
  Filled 2013-10-17: qty 1

## 2013-10-17 MED ORDER — IPRATROPIUM-ALBUTEROL 0.5-2.5 (3) MG/3ML IN SOLN
3.0000 mL | Freq: Four times a day (QID) | RESPIRATORY_TRACT | Status: DC
  Administered 2013-10-17 (×3): 3 mL via RESPIRATORY_TRACT
  Filled 2013-10-17 (×3): qty 3

## 2013-10-17 MED ORDER — SODIUM CHLORIDE 0.9 % IV SOLN: INTRAVENOUS | Status: AC

## 2013-10-17 MED ORDER — FUROSEMIDE 10 MG/ML IJ SOLN
40.0000 mg | Freq: Two times a day (BID) | INTRAMUSCULAR | Status: DC
  Administered 2013-10-17 – 2013-10-19 (×5): 40 mg via INTRAVENOUS
  Filled 2013-10-17 (×7): qty 4

## 2013-10-17 MED ORDER — DEXTROSE 5 % IV SOLN
1.0000 g | INTRAVENOUS | Status: DC
  Administered 2013-10-17 – 2013-10-18 (×2): 1 g via INTRAVENOUS
  Filled 2013-10-17 (×4): qty 10

## 2013-10-17 MED ORDER — CLOPIDOGREL BISULFATE 75 MG PO TABS
75.0000 mg | ORAL_TABLET | Freq: Every day | ORAL | Status: DC
  Administered 2013-10-18 – 2013-10-19 (×2): 75 mg via ORAL
  Filled 2013-10-17 (×4): qty 1

## 2013-10-17 MED ORDER — CARBAMAZEPINE 200 MG PO TABS
200.0000 mg | ORAL_TABLET | Freq: Every day | ORAL | Status: DC
  Administered 2013-10-17: 200 mg via ORAL
  Filled 2013-10-17 (×2): qty 1

## 2013-10-17 MED ORDER — DEXTROSE 5 % IV SOLN
500.0000 mg | INTRAVENOUS | Status: DC
  Administered 2013-10-17: 500 mg via INTRAVENOUS
  Filled 2013-10-17 (×2): qty 500

## 2013-10-17 MED ORDER — ENOXAPARIN SODIUM 30 MG/0.3ML ~~LOC~~ SOLN
30.0000 mg | SUBCUTANEOUS | Status: DC
  Administered 2013-10-17 – 2013-10-19 (×3): 30 mg via SUBCUTANEOUS
  Filled 2013-10-17 (×4): qty 0.3

## 2013-10-17 MED ORDER — SODIUM CHLORIDE 0.9 % IV SOLN
INTRAVENOUS | Status: AC
  Administered 2013-10-17 (×2): via INTRAVENOUS

## 2013-10-17 MED ORDER — DILTIAZEM HCL ER BEADS 120 MG PO CP24
120.0000 mg | ORAL_CAPSULE | Freq: Every morning | ORAL | Status: DC
  Filled 2013-10-17: qty 1

## 2013-10-17 MED ORDER — DONEPEZIL HCL 10 MG PO TABS
10.0000 mg | ORAL_TABLET | Freq: Every day | ORAL | Status: DC
  Administered 2013-10-17 – 2013-10-18 (×3): 10 mg via ORAL
  Filled 2013-10-17 (×5): qty 1

## 2013-10-17 MED ORDER — POTASSIUM CHLORIDE ER 10 MEQ PO TBCR
10.0000 meq | EXTENDED_RELEASE_TABLET | Freq: Every morning | ORAL | Status: DC
  Administered 2013-10-18 – 2013-10-19 (×2): 10 meq via ORAL
  Filled 2013-10-17 (×3): qty 1

## 2013-10-17 MED ORDER — ALPRAZOLAM 0.25 MG PO TABS
0.1250 mg | ORAL_TABLET | Freq: Every day | ORAL | Status: DC
  Administered 2013-10-17: 0.125 mg via ORAL
  Filled 2013-10-17: qty 1

## 2013-10-17 MED ORDER — ONDANSETRON HCL 4 MG/2ML IJ SOLN: 4.0000 mg | Freq: Four times a day (QID) | INTRAMUSCULAR | Status: DC | PRN

## 2013-10-17 MED ORDER — CARBAMAZEPINE 100 MG PO CHEW
100.0000 mg | CHEWABLE_TABLET | Freq: Every day | ORAL | Status: DC
  Administered 2013-10-17 – 2013-10-18 (×2): 100 mg via ORAL
  Filled 2013-10-17 (×3): qty 1

## 2013-10-17 MED ORDER — SODIUM CHLORIDE 0.9 % IV SOLN
INTRAVENOUS | Status: AC
  Administered 2013-10-17: 20 mL/h via INTRAVENOUS

## 2013-10-17 NOTE — Progress Notes (Signed)
PATIENT DETAILS Name: Virginia Goodman Age: 80 y.o. Sex: female Date of Birth: Oct 07, 1918 Admit Date: 10/16/2013 Admitting Physician Dorothea Ogle, MD PCP:No primary provider on file.  Subjective: Lethargic, but arousable. Confused when awake,Initially on 100 % NRB mask  Assessment/Plan: Active Problems: Acute Hypoxic Resp Failure -suspect multifactorial from PNA and acute diastolic heart failure -initially on BiPAP in the ED, then transitioned to 100 %NRB-now on 5-6L via Nasal Cannula -treat with Lasix, Nebs and Antibiotics -repeat CXR in am   PNA (pneumonia) -c/w Rocephin and Zithromax-day 2 -No Fever or Leukocytosis -await blood cultures, Urine antigen for Strep/Legionella neg -await Resp Virus panel -SLP eval when more awake.  Acute Diastolic Heart Failure -per daughter-patient has put on atleast 5 lbs over the past week, she claims that PCP instructed to increase Lasix to 80 mg BID at home.BNP elevated, "wheezing"-start Laxix 40 mg BID -strict I&O, Daily weights  Acute Encephalopathy -secondary to PNA, underlying dementia/delirium.  -Moves all 4 ext-non focal -treat PNA, and follow clinical course. If no improvement, will need a MRI Brain -Suspect will continue to have some sundowning while inpatient-has hx of Dementia  HTN -hold cardizem-while on IV Lasix-resume when able  DM -CBG's stable-c/w SSI  Hx of CVA -c/w Plavix  Hx Dementia/Delirium -c/w Xanax, Tegretol and Aricept  Disposition: Remain inpatient  DVT Prophylaxis: Prophylactic Lovenox   Code Status:  DNR  Family Communication Son and Daughter at bedside  Procedures:  None  CONSULTS:  None  Time spent 40 minutes-which includes 50% of the time with face-to-face with patient/ family and coordinating care related to the above assessment and plan.  MEDICATIONS: Scheduled Meds: . azithromycin  500 mg Intravenous Q24H  . carbamazepine  100 mg Oral QHS  . cefTRIAXone (ROCEPHIN)  IV   1 g Intravenous Q24H  . clopidogrel  75 mg Oral Q breakfast  . diltiazem  120 mg Oral q morning - 10a  . donepezil  10 mg Oral QHS  . enoxaparin (LOVENOX) injection  30 mg Subcutaneous Q24H  . furosemide  40 mg Intravenous BID  . insulin aspart  0-9 Units Subcutaneous TID WC  . ipratropium-albuterol  3 mL Nebulization Q6H  . potassium chloride  10 mEq Oral q morning - 10a   Continuous Infusions: . sodium chloride 20 mL/hr at 10/17/13 0933   PRN Meds:.ALPRAZolam, morphine injection, ondansetron (ZOFRAN) IV  Antibiotics: Anti-infectives   Start     Dose/Rate Route Frequency Ordered Stop   10/17/13 0056  cefTRIAXone (ROCEPHIN) 1 g in dextrose 5 % 50 mL IVPB     1 g 100 mL/hr over 30 Minutes Intravenous Every 24 hours 10/17/13 0056 10/23/13 1959   10/17/13 0056  azithromycin (ZITHROMAX) 500 mg in dextrose 5 % 250 mL IVPB     500 mg 250 mL/hr over 60 Minutes Intravenous Every 24 hours 10/17/13 0056 10/23/13 2059   10/16/13 2030  azithromycin (ZITHROMAX) 500 mg in dextrose 5 % 250 mL IVPB     500 mg 250 mL/hr over 60 Minutes Intravenous  Once 10/16/13 2028 10/16/13 2325   10/16/13 2030  cefTRIAXone (ROCEPHIN) 1 g in dextrose 5 % 50 mL IVPB     1 g 100 mL/hr over 30 Minutes Intravenous  Once 10/16/13 2028 10/16/13 2109       PHYSICAL EXAM: Vital signs in last 24 hours: Filed Vitals:   10/17/13 1010 10/17/13 1100 10/17/13 1140 10/17/13 1200  BP:  107/46  99/40  Pulse:  99  93  Temp:   98.5 F (36.9 C)   TempSrc:   Axillary   Resp:  22  20  Height:      Weight:      SpO2: 94% 99%  94%    Weight change:  Filed Weights   10/17/13 0645  Weight: 81.7 kg (180 lb 1.9 oz)   Body mass index is 30.9 kg/(m^2).   Gen Exam: Confused and lethargic.   Neck: Supple, No JVD.   Chest: Good air entry-wheezing all over CVS: S1 S2 Regular, no murmurs.  Abdomen: soft, BS +, non tender, non distended.  Extremities: no edema, lower extremities warm to touch.Chronic lower ext skin  changes Neurologic: Non Focal-but poor exam-given mental status  Skin: No Rash.   Wounds: N/A.   Intake/Output from previous day:  Intake/Output Summary (Last 24 hours) at 10/17/13 1354 Last data filed at 10/17/13 1200  Gross per 24 hour  Intake 316.33 ml  Output    700 ml  Net -383.67 ml     LAB RESULTS: CBC  Recent Labs Lab 10/16/13 2055 10/17/13 0815  WBC 9.9 12.8*  HGB 12.6 11.8*  HCT 40.1 38.2  PLT 181 162  MCV 104.7* 106.1*  MCH 32.9 32.8  MCHC 31.4 30.9  RDW 14.4 14.5  LYMPHSABS 1.2  --   MONOABS 0.5  --   EOSABS 0.3  --   BASOSABS 0.0  --     Chemistries   Recent Labs Lab 10/16/13 2055 10/17/13 0815  NA 145 142  K 4.1 4.5  CL 100 101  CO2 33* 31  GLUCOSE 179* 112*  BUN 18 18  CREATININE 1.19* 1.11*  CALCIUM 8.7 8.7    CBG:  Recent Labs Lab 10/17/13 0747 10/17/13 1139  GLUCAP 107* 108*    GFR Estimated Creatinine Clearance: 33.4 ml/min (by C-G formula based on Cr of 1.11).  Coagulation profile No results found for this basename: INR, PROTIME,  in the last 168 hours  Cardiac Enzymes  Recent Labs Lab 10/16/13 2055  TROPONINI <0.30    No components found with this basename: POCBNP,  No results found for this basename: DDIMER,  in the last 72 hours No results found for this basename: HGBA1C,  in the last 72 hours No results found for this basename: CHOL, HDL, LDLCALC, TRIG, CHOLHDL, LDLDIRECT,  in the last 72 hours No results found for this basename: TSH, T4TOTAL, FREET3, T3FREE, THYROIDAB,  in the last 72 hours No results found for this basename: VITAMINB12, FOLATE, FERRITIN, TIBC, IRON, RETICCTPCT,  in the last 72 hours No results found for this basename: LIPASE, AMYLASE,  in the last 72 hours  Urine Studies No results found for this basename: UACOL, UAPR, USPG, UPH, UTP, UGL, UKET, UBIL, UHGB, UNIT, UROB, ULEU, UEPI, UWBC, URBC, UBAC, CAST, CRYS, UCOM, BILUA,  in the last 72 hours  MICROBIOLOGY: Recent Results (from the  past 240 hour(s))  MRSA PCR SCREENING     Status: None   Collection Time    10/17/13  6:48 AM      Result Value Ref Range Status   MRSA by PCR NEGATIVE  NEGATIVE Final   Comment:            The GeneXpert MRSA Assay (FDA     approved for NASAL specimens     only), is one component of a     comprehensive MRSA colonization     surveillance program. It is not  intended to diagnose MRSA     infection nor to guide or     monitor treatment for     MRSA infections.    RADIOLOGY STUDIES/RESULTS: Dg Chest Portable 1 View  10/16/2013   CLINICAL DATA:  Respiratory distress  EXAM: PORTABLE CHEST - 1 VIEW  COMPARISON:  July 13, 2010  FINDINGS: There is stable elevation of the right hemidiaphragm. There is consolidation throughout the left lower lobe. Lungs are otherwise clear. Heart is upper normal in size with normal pulmonary vascularity. There is atherosclerotic change in the aorta. No adenopathy. No apparent bone lesions.  IMPRESSION: Left lower lobe consolidation. Persistent elevation right hemidiaphragm.   Electronically Signed   By: Bretta Bang M.D.   On: 10/16/2013 20:12    Jeoffrey Massed, MD  [facility] Pager:[phone]  If 7PM-7AM, please contact night-coverage [email or web address] Password [email or web address] 10/17/2013, 1:54 PM   LOS: 1 day

## 2013-10-17 NOTE — Progress Notes (Signed)
Pt's daughter informed RN that pt usually takes 1/2 pill of Tegretol 200mg  at home. MD notified and order clarified. Pt's daughter stated that pt has taken the full 200 mg before at home and slept very heavily afterward and became agitated. MD notified. Pt currently sleeping, VSS. MD saw pt and aware. Per MD, continue current interventions. Will continue to monitor. Family at bedside.   Holly Bodilyulbertson, [place] Virrueta Leigh

## 2013-10-18 ENCOUNTER — Inpatient Hospital Stay (HOSPITAL_COMMUNITY): Payer: Medicare Other

## 2013-10-18 ENCOUNTER — Encounter (HOSPITAL_COMMUNITY): Payer: Self-pay | Admitting: Internal Medicine

## 2013-10-18 DIAGNOSIS — F411 Generalized anxiety disorder: Secondary | ICD-10-CM

## 2013-10-18 DIAGNOSIS — Z515 Encounter for palliative care: Secondary | ICD-10-CM

## 2013-10-18 LAB — BASIC METABOLIC PANEL
BUN: 16 mg/dL (ref 6–23)
CO2: 38 mEq/L — ABNORMAL HIGH (ref 19–32)
CREATININE: 0.91 mg/dL (ref 0.50–1.10)
Calcium: 8.9 mg/dL (ref 8.4–10.5)
Chloride: 98 mEq/L (ref 96–112)
GFR calc Af Amer: 62 mL/min — ABNORMAL LOW (ref >90)
GFR, EST NON AFRICAN AMERICAN: 53 mL/min — AB (ref >90)
GLUCOSE: 183 mg/dL — AB (ref 70–99)
Potassium: 3.3 mEq/L — ABNORMAL LOW (ref 3.7–5.3)
Sodium: 144 mEq/L (ref 137–147)

## 2013-10-18 LAB — CBC
HEMATOCRIT: 37.3 % (ref 36.0–46.0)
HEMOGLOBIN: 11.6 g/dL — AB (ref 12.0–15.0)
MCH: 32.8 pg (ref 26.0–34.0)
MCHC: 31.1 g/dL (ref 30.0–36.0)
MCV: 105.4 fL — ABNORMAL HIGH (ref 78.0–100.0)
Platelets: 171 10*3/uL (ref 150–400)
RBC: 3.54 MIL/uL — ABNORMAL LOW (ref 3.87–5.11)
RDW: 14.2 % (ref 11.5–15.5)
WBC: 12.7 10*3/uL — ABNORMAL HIGH (ref 4.0–10.5)

## 2013-10-18 LAB — GLUCOSE, CAPILLARY
GLUCOSE-CAPILLARY: 106 mg/dL — AB (ref 70–99)
GLUCOSE-CAPILLARY: 152 mg/dL — AB (ref 70–99)
Glucose-Capillary: 130 mg/dL — ABNORMAL HIGH (ref 70–99)
Glucose-Capillary: 131 mg/dL — ABNORMAL HIGH (ref 70–99)

## 2013-10-18 MED ORDER — ALPRAZOLAM 0.25 MG PO TABS
0.1250 mg | ORAL_TABLET | Freq: Three times a day (TID) | ORAL | Status: DC | PRN
  Filled 2013-10-18: qty 1

## 2013-10-18 MED ORDER — AZITHROMYCIN 500 MG PO TABS
500.0000 mg | ORAL_TABLET | ORAL | Status: DC
  Administered 2013-10-18: 500 mg via ORAL
  Filled 2013-10-18 (×2): qty 1

## 2013-10-18 MED ORDER — IPRATROPIUM-ALBUTEROL 0.5-2.5 (3) MG/3ML IN SOLN: 3.0000 mL | RESPIRATORY_TRACT | Status: DC | PRN

## 2013-10-18 MED ORDER — ALPRAZOLAM 0.25 MG PO TABS
0.1250 mg | ORAL_TABLET | Freq: Three times a day (TID) | ORAL | Status: DC | PRN
  Administered 2013-10-18 (×2): 0.125 mg via ORAL
  Filled 2013-10-18: qty 1

## 2013-10-18 MED ORDER — IPRATROPIUM-ALBUTEROL 0.5-2.5 (3) MG/3ML IN SOLN
3.0000 mL | Freq: Three times a day (TID) | RESPIRATORY_TRACT | Status: DC
  Administered 2013-10-18: 3 mL via RESPIRATORY_TRACT
  Filled 2013-10-18 (×3): qty 3

## 2013-10-18 MED ORDER — ALPRAZOLAM 0.25 MG PO TABS: 0.1250 mg | ORAL_TABLET | Freq: Three times a day (TID) | ORAL | Status: DC | PRN

## 2013-10-18 MED ORDER — SODIUM CHLORIDE 0.9 % IV SOLN: INTRAVENOUS | Status: DC

## 2013-10-18 NOTE — Progress Notes (Signed)
PHARMACIST - PHYSICIAN COMMUNICATION DR:   Jerral RalphGhimire CONCERNING: Antibiotic IV to Oral Route Change Policy  RECOMMENDATION: This patient is receiving azithromycin by the intravenous route.  Based on criteria approved by the Pharmacy and Therapeutics Committee, the antibiotic(s) is/are being converted to the equivalent oral dose form(s).   DESCRIPTION: These criteria include:  Patient being treated for a respiratory tract infection, urinary tract infection, cellulitis or clostridium difficile associated diarrhea if on metronidazole  The patient is not neutropenic and does not exhibit a GI malabsorption state  The patient is eating (either orally or via tube) and/or has been taking other orally administered medications for a least 24 hours  The patient is improving clinically and has a Tmax < 100.5  If you have questions about this conversion, please contact the Pharmacy Department  []   [phone]( [phone] )  [facility] [x]   [phone]( [phone] )  [facility]  []   [phone]( [phone] )  [facility] []   [phone]( [phone] )  [facility]   Dorna LeitzAnh Arshan Jabs, PharmD, BCPS

## 2013-10-18 NOTE — Progress Notes (Signed)
[facility] regarding [facility]; pt also discussed with Dr Derenda MisMelissa Taylor after GOC meeting. Writer spoke via t/c with pt's daughter Scarlette CalicoFrances to inform briefly about services and she indicated she had spoken with her brothers and they wanted to move forward with evaluation for home with hospice and was agreeable to writer coming to speak with her at pt's room.  Discussed services Scarlette CalicoFrances called brother Windy FastRonald and spoke with him during this conversation- Windy FastRonald asked about Physical Therapy -informed that a PT evaluation for safety/mobility can be requested however on-going PT was not something covered by the [facility], Scarlette CalicoFrances informed they had [facility] and there were some sessions when the pt could or would not work with the therapist and that given her continued decline she was not sure it would benefit her mother- informed that both Home Health services and Hospice services would not be covered by Medicare and the family would need to choose - Offered to speak again with Scarlette CalicoFrances int he morning however she indicated she would like to proceed and set up for [facility] to come to the home after discharge   -During visit pt with some agitation, intermittently crying and laughing, speech garbled at times, pt talking with people who were not in the room, pt was able to inform dtr she needed to urinate, required 2 person assist [facility], dtr stated prior to this hospital admit pt was somewhat functional at home able to get around with 'some assist' and walker; she voiced concern that with this decline her mother's care needs Izquierdo be more than can be handled -  She informed that her mother and father live in the home and children help as needed - she stated that the family will work out arrangements to have someone with her mom whenever it's needed; Scarlette CalicoFrances was concerned that the longer her mother remained in the hospital the more agitated she  would become and is anxious to get her mother home to familiar surroundings; Scarlette CalicoFrances asked about Xanax to help with pt's anxiety/agitation Staff RN Vernona RiegerLaura aware and contacted Dr Sharon SellerMcClung   - the following questions were raised by Scarlette CalicoFrances 1) pt was on 2 L O2 at home - currently on 6 LNC - can priamry team indicate O2  Liter Flow pt will discharge home on  - pt [facility]- per dtr,  family is agreeable to [facility] as [facility] - pt will need an O2 concentrator that goes to 10 L to accommodate current liter flow of 6LNC  And [facility] notified of this request [facility] by [facility] Cheryl and they will contact dtr 2) Daughter unsure if pt will be able to transport by personal vehicle and wants to see how pt is tomorrow before deciding on needs for transport [facility]. Writer will follow up in am **Please send completed GOLD DNR FORM home with pt  Valente DavidMargie Joshue Badal, RN Hospice Liaison [phone]

## 2013-10-18 NOTE — Consult Note (Signed)
[name]      DOB: 1918/09/03      WUJ:[id number]     Consult Note from the Palliative Medicine Team at [facility]    Consult Requested by: Dr. Sharon Goodman    PCP: Virginia Goodman Reason for Consultation:  GOC     Phone Number:[phone] Related symptom rec Assessment of patients Current state: 80 yr old white female with known past medical history for advanced dementia, likely stage 6 ( had been up and around and helped spouse with ADL, but very anxious and not oriented to to time or place.Looks to her spouse and daughter for safety and orientation).  Patient unable to participate in goals of care due to dementia and agitation.  Daughter at this time states that she is leaning toward hospice support in that her family would prefer to treat her at home and not return to hospital .  We talked about her tendency toward aspiration (patient was sent home in 2011 with thickener but has not been using that).  Daughter aware that this will likely recur if aspiration playing a role.  Family wants to get patient home as soon as possible and consider hospice support.  We discussed the philosophy of care and  I expressed to the patient's daughter who acts as POA in conjunction with her other brother.  She would like to do a home visit with Hospice of her choice to determine if it is time to get them involved.  If the decision is to not go with hospice, then daughter has requested additional personal care services to add in keeping her mother at home.     Goals of Care: 1.  Code Status: DNR   2. Scope of Treatment: Daughter relates at this time family in accord with trying to keep patient at home even to her death but she is less firm with whether they are all on the same page regarding taking on a hospice philosophy at this time.  She would like to meet with hospice at home with her brothers and father to discuss this option.   4. Disposition: Home with Hospice Education referral  potential for enrollment if all family agree.   3. Symptom Management:   1. Anxiety/Agitation: Agree with continued xanax which has worked well for her to date.  Continue aricept 2. Pain: none at this time. 3. Dyspnea: continue to treat pneumonia, if concern .  On home oxygen. Unless symptoms significantly progress would not add opiates at this time which [place] exacerbate pneumonia. Agree with lasix.  Previous Echo shows EF 60% 4. Bowel Regimen:monitor  5. Delirium: related to dementia and unfamiliar environment.  Dr. Nicholos Goodman confirms marked decline in cognitive status , in 2014 MMSE 21/30. 6. Heart Failure: agree with treat symptoms. Do not see how further investigation with Echo would change treatment in light of family leaning toward palliative  Care approach with possible hospice. 7. Dysphagia: family had been told in past there was an element of dysphagia, prefer comfort feeding as patient did not like thickener.  I reviewed the cost of comfort feeding with daughter in that the family [place] need to be prepared to address recurrent treatment for pneumonia. She would like to discuss this with brothers and hospice. 8. Fever: prn tylenol  4. Psychosocial: Raised 5 children and assisted spouse on the farm.  5. Spiritual: spiritual care available prn   Discussed case with Dr. Nicholos Goodman who relates he would be glad to support Hospice care for his  patient but usually defers symptom management to Hospice Physician.     Patient Documents Completed or Given: Document Given Completed  Advanced Directives Pkt    MOST X   DNR    Gone from My Sight    Hard Choices      Brief HPI: 80 yr old white female with advancing dementia admitted with shortness of breath found to have pneumonia. We were asked to assist with additional support of care at home possibly with hospice care approach.   ROS: Unable to contribute due to advanced dementia and likely some hearing loss.    PMH:  Past  Medical History  Diagnosis Date  . CHF (congestive heart failure)   . Stroke   . Dementia   . Diabetes mellitus without complication      PSH: Past Surgical History  Procedure Laterality Date  . Cholecystectomy     I have reviewed the FH and SH and  If appropriate update it with new information. Allergies  Allergen Reactions  . Captopril Other (See Comments)    REACTION: unknown  . Codeine Nausea Only  . Sertraline Hcl Nausea Only   Scheduled Meds: . azithromycin  500 mg Oral Q24H  . carbamazepine  100 mg Oral QHS  . cefTRIAXone (ROCEPHIN)  IV  1 g Intravenous Q24H  . clopidogrel  75 mg Oral Q breakfast  . donepezil  10 mg Oral QHS  . enoxaparin (LOVENOX) injection  30 mg Subcutaneous Q24H  . furosemide  40 mg Intravenous BID  . insulin aspart  0-9 Units Subcutaneous TID WC  . ipratropium-albuterol  3 mL Nebulization TID  . potassium chloride  10 mEq Oral q morning - 10a   Continuous Infusions: . sodium chloride 10 mL/hr at 10/18/13 0700   PRN Meds:.ALPRAZolam, morphine injection, ondansetron (ZOFRAN) IV    BP 148/71  Pulse 91  Temp(Src) 98.2 F (36.8 C) (Oral)  Resp 32  Ht 5\' 4"  (1.626 m)  Wt 81.7 kg (180 lb 1.9 oz)  BMI 30.90 kg/m2  SpO2 89%   PPS: 40%     Intake/Output Summary (Last 24 hours) at 10/18/13 1249 Last data filed at 10/18/13 1200  Gross per 24 hour  Intake    630 ml  Output   1125 ml  Net   -495 ml   LBM: 4/1                   Physical Exam:  General: anxious, asking daughter if I can be trusted,  Perseverates on location of her spouse HEENT:   [name] AT PERRL, EOMI , anicteric , decreased hearing , word finding issues Chest:    Decreased with crackles left greater than right, no wheezing CVS: regular, S1, S2, no S3 Abdomen:obese , soft not tender, not distended Ext: trace not pitting edema bilateral lower extremities Neuro: not oriented to time or place , perseverates on location of space, unable to process explanations,  Very  anxious.  Labs: CBC    Component Value Date/Time   WBC 12.7* 10/18/2013 0930   RBC 3.54* 10/18/2013 0930   HGB 11.6* 10/18/2013 0930   HCT 37.3 10/18/2013 0930   PLT 171 10/18/2013 0930   MCV 105.4* 10/18/2013 0930   MCH 32.8 10/18/2013 0930   MCHC 31.1 10/18/2013 0930   RDW 14.2 10/18/2013 0930   LYMPHSABS 1.2 10/16/2013 2055   MONOABS 0.5 10/16/2013 2055   EOSABS 0.3 10/16/2013 2055   BASOSABS 0.0 10/16/2013 2055  CMP     Component Value Date/Time   NA 144 10/18/2013 0930   K 3.3* 10/18/2013 0930   CL 98 10/18/2013 0930   CO2 38* 10/18/2013 0930   GLUCOSE 183* 10/18/2013 0930   GLUCOSE 136* 07/29/2006 1457   BUN 16 10/18/2013 0930   CREATININE 0.91 10/18/2013 0930   CALCIUM 8.9 10/18/2013 0930   PROT 6.5 10/16/2013 2055   ALBUMIN 3.0* 10/16/2013 2055   AST 18 10/16/2013 2055   ALT 12 10/16/2013 2055   ALKPHOS 106 10/16/2013 2055   BILITOT <0.2* 10/16/2013 2055   GFRNONAA 53* 10/18/2013 0930   GFRAA 62* 10/18/2013 0930    Chest Xray Reviewed/Impressions:  There is worsening in aeration with hazy interstitial prominence  vascular congestion and airspace disease bilateral centrally highly  suspicious for pulmonary edema. Small left pleural effusion with  left basilar atelectasis or infiltrate. Trace right pleural effusion  with right basilar atelectasis     Time In Time Out Total Time Spent with Patient Total Overall Time  11:39 am 12 10 pm 10 min 32 min    Greater than 50%  of this time was spent counseling and coordinating care related to the above assessment and plan.  Jaysun Wessels L. Ladona Ridgelaylor, MD MBA The Palliative Medicine Team at [facility] Team Phone: [phone] Pager: [phone]

## 2013-10-18 NOTE — Evaluation (Signed)
Clinical/Bedside Swallow Evaluation Patient Details  Name: Virginia SaundersMildred L Goodman MRN: [id number] Date of Birth: 08/19/1918  Today's Date: 10/18/2013 Time: [id number] SLP Time Calculation (min): 30 min  Past Medical History:  Past Medical History  Diagnosis Date  . CHF (congestive heart failure)   . Stroke   . Dementia   . Diabetes mellitus without complication    Past Surgical History:  Past Surgical History  Procedure Laterality Date  . Cholecystectomy     HPI:  80 yo female with advanced dementia, lives at home with family, [facility] after noted to be more lethargic at home and with shortness of breath with even minimal exertion. Productive cough noted for the past several days with poor oral intake, no known sick contacts or exposures.  CXR There is worsening in aeration with hazy interstitial prominence vascular congestion and airspace disease bilateral centrally highly suspicious for pulmonary edema. Small left pleural effusion with left basilar atelectasis or infiltrate. Trace right pleural effusion with right basilar atelectasis.  Prior MBS 12/23/09 with silent aspiration of thin and Dys 3, nectar recommended.   Assessment / Plan / Recommendation Clinical Impression  Pt. exhibiting possible pharyngeal dysphagia with observations including multiple swallows (2-4) indicative of likely pharyngeal residue.  No outward cough, throact clear to wet vocal quality.  Pt. has history of silent aspiration and unable to diagnose at bedside.  Discussed with pt.'s daughter with various options and daughter prefers not to proceed with MBS and continue diet/liquids which is very reasonable.  SLP educated her on potential swallow difficulties as dementia progresses as well as swallow strategies to decrease aspiration risk,  Upgraded diet to Dys 3, thin, pills whole in applesauce, small sips and bites.  No f/u needed.    Aspiration Risk  Moderate    Diet Recommendation Dysphagia 3 (Mechanical  Soft);Thin liquid   Liquid Administration via: Cup;Straw Medication Administration: Whole meds with puree Supervision: Patient able to self feed;Full supervision/cueing for compensatory strategies Compensations: Slow rate;Small sips/bites;Check for pocketing Postural Changes and/or Swallow Maneuvers: Seated upright 90 degrees;Upright 30-60 min after meal    Other  Recommendations Oral Care Recommendations: Oral care BID   Follow Up Recommendations  None    Frequency and Duration        Pertinent Vitals/Pain WDL         Swallow Study           Oral/Motor/Sensory Function Overall Oral Motor/Sensory Function:  (unable formally assess, pt. anxious, no overt weakness)   Ice Chips Ice chips: Not tested   Thin Liquid Thin Liquid: Impaired Presentation: Cup;Straw Pharyngeal  Phase Impairments: Suspected delayed Swallow    Nectar Thick Nectar Thick Liquid: Not tested   Honey Thick Honey Thick Liquid: Not tested   Puree Puree: Impaired Presentation: Spoon Pharyngeal Phase Impairments: Multiple swallows;Suspected delayed Swallow   Solid   GO    Solid: Within functional limits       Breck CoonsLisa Willis SL[employer] CorporationLitaker M.Ed [employer]-SLP Pager [phone]  10/18/2013

## 2013-10-18 NOTE — Progress Notes (Addendum)
[facility]  Huxley Vanwagoner Velis RUE:[id number] DOB: July 19, 1919 DOA: 10/16/2013 PCP: Georgianne Fick, MD  Time spent : 35 mins  Brief narrative: 80 yo female with advanced dementia, lives at home with family, brought to [facility] ED via EMS after noted to be more lethargic at home with shortness of breath with even minimal exertion. Pt was unable to provide history due to dementia and confusion. She apparently has oxygen at home but that had not seemed to have helped. There were no reported fevers, chills, abdominal or urinary concerns. Productive cough noted for the past several days with poor oral intake, no known sick contacts or exposures.   Upon arrival to ED, pt noted to be in respiratory distress with oxygen saturation in 80's, requiring BiPAP and responding well. Transitioned successfully to NRB and eventually to [place] 4 - 6 L. CXR worrisome for PNA.   HPI/Subjective: Awake and very confused which is her baseline. Labile emotions.  Assessment/Plan:  Acute hypoxic respiratory failure due to CAP and DHF - has thus far been weaned down to [place] oxygen - cont Zithromax and Rocephin IV with plan to transition to oral abx at d/c - Flu PCR negative, HIV negative, urinary Legionella and Strep negative - provide supportive care as needed   Acute encephalopathy/underlying end stage dementia - due to acute infection with hypoxia as well as change in usual environment - management with ABX as noted above  - once able to take PO, provide home medications  - daughter has requested Palliative eval for GOC and d/c home asap   Acute on chronic renal failure  - likely simply due to acute illness - crt has now normalized  HTN - continue home medical regimen   DM  - cont SSI   DVT prophylaxis: Lovenox Code Status: DO NOT RESUSCITATE Family Communication: Daughter at bedside Disposition Plan/Expected LOS: Transfer to floor with hope to d/c home  4/2  Consultants: Palliative Medicine  Procedures: None  Antibiotics: Zithromax 3/30 >>> Rocephin 3/30 >>>  Objective: Blood pressure 148/71, pulse 91, temperature 98.2 F (36.8 C), temperature source Oral, resp. rate 32, height 5\' 4"  (1.626 m), weight 180 lb 1.9 oz (81.7 kg), SpO2 89.00%.  Intake/Output Summary (Last 24 hours) at 10/18/13 1305 Last data filed at 10/18/13 1200  Gross per 24 hour  Intake    610 ml  Output   1125 ml  Net   -515 ml   Exam: General: No acute respiratory distress at rest  Lungs: Clear to auscultation bilaterally without wheezes or crackles, 6 L Cardiovascular: Regular rate and rhythm without murmur gallop or rub normal S1 and S2, no peripheral edema or JVD Abdomen: Nontender, nondistended, soft, bowel sounds positive, no rebound, no ascites, no appreciable mass Musculoskeletal: No significant cyanosis, clubbing of bilateral lower extremities Neurological: Alert and oriented x name only, moves all extremities x 4 without focal neurological deficits, CN 2-12 intact - very labile emotions noting cries easily  Scheduled Meds:  Scheduled Meds: . azithromycin  500 mg Oral Q24H  . carbamazepine  100 mg Oral QHS  . cefTRIAXone (ROCEPHIN)  IV  1 g Intravenous Q24H  . clopidogrel  75 mg Oral Q breakfast  . donepezil  10 mg Oral QHS  . enoxaparin (LOVENOX) injection  30 mg Subcutaneous Q24H  . furosemide  40 mg Intravenous BID  . insulin aspart  0-9 Units Subcutaneous TID WC  . ipratropium-albuterol  3 mL Nebulization TID  .  potassium chloride  10 mEq Oral q morning - 10a   Data Reviewed: Basic Metabolic Panel:  Recent Labs Lab 10/16/13 2055 10/17/13 0815 10/18/13 0930  NA 145 142 144  K 4.1 4.5 3.3*  CL 100 101 98  CO2 33* 31 38*  GLUCOSE 179* 112* 183*  BUN 18 18 16   CREATININE 1.19* 1.11* 0.91  CALCIUM 8.7 8.7 8.9   Liver Function Tests:  Recent Labs Lab 10/16/13 2055  AST 18  ALT 12  ALKPHOS 106  BILITOT <0.2*  PROT 6.5   ALBUMIN 3.0*   CBC:  Recent Labs Lab 10/16/13 2055 10/17/13 0815 10/18/13 0930  WBC 9.9 12.8* 12.7*  NEUTROABS 7.9*  --   --   HGB 12.6 11.8* 11.6*  HCT 40.1 38.2 37.3  MCV 104.7* 106.1* 105.4*  PLT 181 162 171   Cardiac Enzymes:  Recent Labs Lab 10/16/13 2055  TROPONINI <0.30   BNP (last 3 results)  Recent Labs  10/16/13 2055  PROBNP 804.7*   CBG:  Recent Labs Lab 10/17/13 1139 10/17/13 1556 10/17/13 2136 10/18/13 0754 10/18/13 1159  GLUCAP 108* 110* 142* 106* 152*    Recent Results (from the past 240 hour(s))  CULTURE, BLOOD (ROUTINE X 2)     Status: None   Collection Time    10/17/13  1:59 AM      Result Value Ref Range Status   Specimen Description BLOOD RIGHT WRIST   Final   Special Requests     Final   Value: BOTTLES DRAWN AEROBIC AND ANAEROBIC 10CC EACH PATIENT ON FOLLOWING ROCEPHIN 1G AZITHROMYCIN 500MG    Culture  Setup Time     Final   Value: 10/17/2013 08:15     Performed at [facility]   Culture     Final   Value:        BLOOD CULTURE RECEIVED NO GROWTH TO DATE CULTURE WILL BE HELD FOR 5 DAYS BEFORE ISSUING A FINAL NEGATIVE REPORT     Performed at [facility]   Report Status PENDING   Incomplete  CULTURE, BLOOD (ROUTINE X 2)     Status: None   Collection Time    10/17/13  2:06 AM      Result Value Ref Range Status   Specimen Description BLOOD RIGHT HAND   Final   Special Requests     Final   Value: BOTTLES DRAWN AEROBIC ONLY 10CC PATIENT ON FOLLOWING ROCEPHIN 1G AZITHROMYCIN 500MG    Culture  Setup Time     Final   Value: 10/17/2013 08:15     Performed at [facility]   Culture     Final   Value:        BLOOD CULTURE RECEIVED NO GROWTH TO DATE CULTURE WILL BE HELD FOR 5 DAYS BEFORE ISSUING A FINAL NEGATIVE REPORT     Performed at [facility]   Report Status PENDING   Incomplete  MRSA PCR SCREENING     Status: None   Collection Time    10/17/13  6:48 AM      Result Value Ref Range Status   MRSA by  PCR NEGATIVE  NEGATIVE Final   Comment:            The GeneXpert MRSA Assay (FDA     approved for NASAL specimens     only), is one component of a     comprehensive MRSA colonization     surveillance program. It is not  intended to diagnose MRSA     infection nor to guide or     monitor treatment for     MRSA infections.     Studies:  Recent x-ray studies have been reviewed in detail by the Attending Physician       Junious Silk, ANP [facility] Office  [phone] Pager [phone]  **If unable to reach the above provider after paging please contact the Flow Manager @ [phone]  On-Call/Text Page:      [email or web address]      password [email or web address]  If 7PM-7AM, please contact night-coverage [email or web address] Password [email or web address] 10/18/2013, 1:05 PM   LOS: 2 days   I have personally examined this patient and reviewed the entire database. I have reviewed the above note, made any necessary editorial changes, and agree with its content.  Lonia Blood, MD [facility]

## 2013-10-18 NOTE — Evaluation (Signed)
Physical Therapy Evaluation Patient Details Name: Virginia Goodman MRN: [id number] DOB: August 14, 1918 Today's Date: 10/18/2013   History of Present Illness  80 yo female with advanced dementia, lives at home with family, brought to [facility] ED via EMS after noted to be more lethargic at home and with shortness of breath with even minimal exertion. Productive cough noted for the past several days with poor oral intake, no known sick contacts or exposures.  CXR There is worsening in aeration with hazy interstitial prominence vascular congestion and airspace disease bilateral centrally highly suspicious for pulmonary edema. Small left pleural effusion with left basilar atelectasis or infiltrate. Trace right pleural effusion with right basilar atelectasis.   Clinical Impression  Pt adm due to the above. Presents with decreased functional mobility secondary to deficits indicated below. Pt to benefit form skilled acute PT to maximize functional mobility. Pt has 24/7 support at home. Daughter is planning to discuss goals of care with palliative today. Pt requires max redirection to stay on task during session and becomes upset and asks for her husband frequently. Pt is a high fall risk due to balance and cognitive deficits.     Follow Up Recommendations Home health PT;Supervision/Assistance - 24 hour    Equipment Recommendations  None recommended by PT    Recommendations for Other Services       Precautions / Restrictions Precautions Precautions: Fall Restrictions Weight Bearing Restrictions: No      Mobility  Bed Mobility Overal bed mobility: Needs Assistance Bed Mobility: Supine to Sit     Supine to sit: Min assist;HOB elevated     General bed mobility comments: pt requires max cues and min (A) to achieve sititng position at EOB; pt has difficulty elevating trunk to sitting position   Transfers Overall transfer level: Needs assistance Equipment used: Rolling walker (2 wheeled) Transfers: Sit  to/from Stand Sit to Stand: Min assist         General transfer comment: cues for hand placement and safety with RW; pt tends to pull up from RW; cues for safety throughout; pt demo increased stability when transferring from sit to stand in chair secondary to arm rests    Ambulation/Gait Ambulation/Gait assistance: Min assist Ambulation Distance (Feet): 80 Feet Assistive device: Rolling walker (2 wheeled) Gait Pattern/deviations: Step-through pattern;Decreased stride length;Trunk flexed;Narrow base of support;Drifts right/left Gait velocity: decreased Gait velocity interpretation: <1.8 ft/sec, indicative of risk for recurrent falls General Gait Details: pt with difficulty managing RW with gt; requires cues and (A) to maintain balance and manage RW; stops frequently when becoming distracted   Stairs            Wheelchair Mobility    Modified Rankin (Stroke Patients Only)       Balance Overall balance assessment: Needs assistance Sitting-balance support: Feet supported;Single extremity supported Sitting balance-Leahy Scale: Poor Sitting balance - Comments: requries UE support   Standing balance support: During functional activity;Bilateral upper extremity supported Standing balance-Leahy Scale: Poor Standing balance comment: requires bil UE support and min (A) to maintain balance                      Pertinent Vitals/Pain Stable t/o session. Ambulated on 6L O2; is normally on 2L at home    Home Living Family/patient expects to be discharged to:: Private residence Living Arrangements: Spouse/significant other Available Help at Discharge: Family;Available 24 hours/day;Personal care attendant Type of Home: House Home Access: Stairs to enter Entrance Stairs-Rails: Can reach both;Left;Right Entrance Stairs-Number of  Steps: 3 Home Layout: One level   Additional Comments: has HH aide 2x a week to give pt bath; pt has walk in shower with handicap rails  and  elevated toilet seat     Prior Function Level of Independence: Needs assistance   Gait / Transfers Assistance Needed: per daughter; pt was ambulatory in house with RW  ADL's / Homemaking Assistance Needed: husband and aide (A) with shower and getting dressed; husband responsible for cooking/cleaning         Hand Dominance        Extremity/Trunk Assessment   Upper Extremity Assessment: Defer to OT evaluation           Lower Extremity Assessment: Generalized weakness      Cervical / Trunk Assessment: Kyphotic  Communication   Communication: No difficulties  Cognition Arousal/Alertness: Awake/alert Behavior During Therapy: Anxious;Restless Overall Cognitive Status: History of cognitive impairments - at baseline                      General Comments General comments (skin integrity, edema, etc.): daughter present throughout session; pt requires max redirection to stay on task; pt becomes very upset and asks for husband throughout session      Exercises        Assessment/Plan    PT Assessment Patient needs continued PT services  PT Diagnosis Abnormality of gait;Generalized weakness   PT Problem List Decreased strength;Decreased activity tolerance;Decreased balance;Decreased cognition;Decreased mobility;Decreased knowledge of use of DME;Decreased safety awareness;Cardiopulmonary status limiting activity  PT Treatment Interventions DME instruction;Gait training;Functional mobility training;Stair training;Therapeutic activities;Therapeutic exercise;Balance training;Neuromuscular re-education;Patient/family education   PT Goals (Current goals can be found in the Care Plan section) Acute Rehab PT Goals Patient Stated Goal: none stated PT Goal Formulation: With patient Time For Goal Achievement: 11/01/13 Potential to Achieve Goals: Fair    Frequency Min 3X/week   Barriers to discharge        End of Session Equipment Utilized During Treatment: Gait  belt;Oxygen (6L ) Activity Tolerance: Patient tolerated treatment well Patient left: in chair;with call bell/phone within reach;with nursing/sitter in room;with chair alarm set         Time: [id number] PT Time Calculation (min): 30 min   Charges:   PT Evaluation $Initial PT Evaluation Tier I: 1 Procedure PT Treatments $Gait Training: 23-37 mins   PT G CodesDonell Sievert:          Haizel Gatchell N, [place]PT [phone] 10/18/2013, 1:15 PM

## 2013-10-19 DIAGNOSIS — F03918 Unspecified dementia, unspecified severity, with other behavioral disturbance: Secondary | ICD-10-CM

## 2013-10-19 DIAGNOSIS — F0391 Unspecified dementia with behavioral disturbance: Secondary | ICD-10-CM

## 2013-10-19 LAB — GLUCOSE, CAPILLARY
Glucose-Capillary: 125 mg/dL — ABNORMAL HIGH (ref 70–99)
Glucose-Capillary: 145 mg/dL — ABNORMAL HIGH (ref 70–99)

## 2013-10-19 MED ORDER — LEVOFLOXACIN 500 MG PO TABS: 500.0000 mg | ORAL_TABLET | Freq: Every day | ORAL | Status: AC

## 2013-10-19 NOTE — Care Management Note (Signed)
   CARE MANAGEMENT NOTE 10/19/2013  Patient:  Virginia Goodman,Virginia Goodman   Account Number:  [id number]  Date Initiated:  10/18/2013  Documentation initiated by:  Virginia Goodman  Subjective/Objective Assessment:   Not[facility] RN rep Virginia Goodman of transfer of this pt to 6E and family plan to d/c to home with hospice services.     Action/Plan:   Per Virginia HausenM Lester RN recommendation this CM ordered home oxygen to be delivered at 6l/im and need for delivery to pt home , provided daughter phone number as contact.   Anticipated DC Date:  10/19/2013   Anticipated DC Plan:  HOME W HOSPICE CARE         PAC Choice  DURABLE MEDICAL EQUIPMENT  HOME HEALTH   Choice offered to / List presented to:  C-4 Adult Children   DME arranged  OXYGEN      DME agency  [facility]     [facility]  HH-1 RN  HH-4 NURSE'S AIDE  HH-6 SOCIAL WORKER      HH agency  [facility] [place]   Status of service:  Completed, signed off Medicare Important Message given?   (If response is "NO", the following Medicare IM given date fields will be blank) Date Medicare IM given:   Date Additional Medicare IM given:    Discharge Disposition:  HOME W HOSPICE CARE  Per UR Regulation:    If discussed at Long Length of Stay Meetings, dates discussed:    Comments:  10/19/2013 Pt has no further DME needs at this time other than home oxygen. Pt has oxygen from [employer], [facility] the pt daughter will call [employer] and asked that their oxygen be picked up. Virginia Shockheryl Roselle Norton RN MPH, case manager, [phone]

## 2013-10-19 NOTE — Clinical Documentation Improvement (Signed)
Presents with Acute Respiratory failure, pneumonia and Acute Diastolic CHF. CKD is documented in 4/1 progress note.   White female  GRF range this admit is 38 to 853.    Please clarify the stage of CKD the patient has for this admission.  _______CKD Stage I - GFR > OR = 90 _______CKD Stage II - GFR 60-80 _______CKD Stage III - GFR 30-59 _______CKD Stage IV - GFR 15-29 _______CKD Stage V - GFR < 15 _______ESRD (End Stage Renal Disease) _______Other condition_____________    Jessie Foothank You, Larenz Frasier T Domenique Southers ,RN Clinical Documentation Specialist:  [phone]  [facility]- Health Information Management

## 2013-10-19 NOTE — Discharge Summary (Signed)
Physician Discharge Summary  Virginia SaundersMildred L Goodman VHQ:[id number] DOB: 07/24/1918 DOA: 10/16/2013     PCP: Georgianne FickAMACHANDRAN,AJITH, MD  Admit date: 10/16/2013 Discharge date: 10/19/2013  Time spent: 30 minutes  Recommendations for Outpatient Follow-up:  1. Follow up with Home Health Hospice 2. Follow up with PCP as needed 3. Hospice Home Health; continue oxygen via nasal cannula  Discharge Diagnoses:  Active Problems:   Acute Respiratory Failure due to CAP and CHF exacerbation   Acute metabolic encephalopathy- resolved   End Stage Dementia   Acute on Chronic Renal Failure   HTN   Diabetes Mellitus   Discharge Condition: stable  Diet recommendation: Carbohydrate modified  Filed Weights   10/17/13 0645 10/18/13 2100  Weight: 180 lb 1.9 oz (81.7 kg) 172 lb 13.5 oz (78.4 kg)    History of present illness:  80 yo female with advanced dementia, lives at home with family, [facility] after noted to be more lethargic at home with shortness of breath with even minimal exertion. Pt was unable to provide history due to dementia and confusion. She apparently has oxygen at home but that had not seemed to have helped. There were no reported fevers, chills, abdominal or urinary concerns. Productive cough noted for the past several days with poor oral intake, no known sick contacts or exposures.   Upon arrival to ED, pt noted to be in respiratory distress with oxygen saturation in 80's, requiring BiPAP and responding well. Transitioned successfully to NRB and eventually to [place] 4 - 6 L. CXR worrisome for PNA.    Hospital Course:  Acute hypoxic respiratory failure due to CAP and DHF  - successfully weaned from 100% NRB to 2 L  - initially treated with Zithromax and Rocephin IV with plan to transition to Levaquin to continue for 3 days after discharge- will complete a 7 day course - Flu PCR negative, HIV negative, urinary Legionella and Strep negative   Acute encephalopathy/underlying end stage  dementia  - due to acute infection with hypoxia as well as change in usual environment -daughter says back to baseline -Plan is to dc home with Hospice Home Health  Acute on chronic renal failure  - likely simply due to acute illness - crt has now normalized   HTN - continue home medical regimen   DM  - SSI while acutely ill -diet controlled at home   Procedures:  None  Consultations:  Palliative Medicine  Discharge Exam: Filed Vitals:   10/19/13 1004  BP: 136/64  Pulse: 105  Temp: 98.5 F (36.9 C)  Resp: 20   General: No acute respiratory distress at rest  Lungs: Clear to auscultation bilaterally without wheezes or crackles, 2 L  Cardiovascular: Regular rate and rhythm without murmur gallop or rub normal S1 and S2, no peripheral edema or JVD  Abdomen: Nontender, nondistended, soft, bowel sounds positive, no rebound, no ascites, no appreciable mass  Musculoskeletal: No significant cyanosis, clubbing of bilateral lower extremities  Neurological: Alert and oriented x name only, moves all extremities x 4 without focal neurological deficits, CN 2-12 intact -sleeping frequently which daughter describes as her baseline   Discharge Instructions      Discharge Orders   Future Orders Complete By Expires   Call MD for:  persistant nausea and vomiting  As directed    Call MD for:  severe uncontrolled pain  As directed    Diet Carb Modified  As directed    Discharge instructions  As directed  Comments:     Continue nasal cannula oxygen at 2 L-up titrate as needed for comfort Home Health per Hospice Yellow DNR form completed and to be sent home with patient   For home use only DME oxygen  As directed    Questions:     Mode or (Route):  Nasal cannula   Liters per Minute:  2   Frequency:  Continuous (stationary and portable oxygen unit needed)   Oxygen conserving device:  Yes   Increase activity slowly  As directed        Medication List    STOP taking these  medications       ciprofloxacin 500 MG tablet  Commonly known as:  CIPRO      TAKE these medications       ALPRAZolam 0.25 MG tablet  Commonly known as:  XANAX  Take 0.125 mg by mouth at bedtime.     carbamazepine 200 MG tablet  Commonly known as:  TEGRETOL  Take 200 mg by mouth at bedtime.     clopidogrel 75 MG tablet  Commonly known as:  PLAVIX  Take 75 mg by mouth daily with breakfast.     diltiazem 120 MG 24 hr capsule  Commonly known as:  TIAZAC  Take 120 mg by mouth every morning.     donepezil 10 MG tablet  Commonly known as:  ARICEPT  Take 10 mg by mouth at bedtime.     furosemide 80 MG tablet  Commonly known as:  LASIX  Take 80 mg by mouth every morning.     ibuprofen 200 MG tablet  Commonly known as:  ADVIL,MOTRIN  Take 400 mg by mouth daily.     levofloxacin 500 MG tablet  Commonly known as:  LEVAQUIN  Take 1 tablet (500 mg total) by mouth daily.  Start taking on:  10/20/2013     potassium chloride 10 MEQ tablet  Commonly known as:  K-DUR  Take 10 mEq by mouth every morning.     PRESERVISION AREDS Tabs  Take 1 tablet by mouth daily.       Allergies  Allergen Reactions  . Captopril Other (See Comments)    REACTION: unknown  . Codeine Nausea Only  . Sertraline Hcl Nausea Only      The results of significant diagnostics from this hospitalization (including imaging, microbiology, ancillary and laboratory) are listed below for reference.    Significant Diagnostic Studies: Dg Chest Port 1 View  10/18/2013   CLINICAL DATA:  Shortness of Breath  EXAM: PORTABLE CHEST - 1 VIEW  COMPARISON:  10/16/2013  FINDINGS: Elevation of the right hemidiaphragm again noted. There is worsening in aeration with hazy interstitial prominence vascular congestion and airspace disease bilateral centrally highly suspicious for pulmonary edema. Small left pleural effusion with left basilar atelectasis or infiltrate. Trace right pleural effusion with right basilar atelectasis.   IMPRESSION: There is worsening in aeration with hazy interstitial prominence vascular congestion and airspace disease bilateral centrally highly suspicious for pulmonary edema. Small left pleural effusion with left basilar atelectasis or infiltrate. Trace right pleural effusion with right basilar atelectasis.   Electronically Signed   By: Natasha Mead M.D.   On: 10/18/2013 07:28   Dg Chest Portable 1 View  10/16/2013   CLINICAL DATA:  Respiratory distress  EXAM: PORTABLE CHEST - 1 VIEW  COMPARISON:  July 13, 2010  FINDINGS: There is stable elevation of the right hemidiaphragm. There is consolidation throughout the left lower lobe. Lungs are otherwise  clear. Heart is upper normal in size with normal pulmonary vascularity. There is atherosclerotic change in the aorta. No adenopathy. No apparent bone lesions.  IMPRESSION: Left lower lobe consolidation. Persistent elevation right hemidiaphragm.   Electronically Signed   By: Bretta Bang M.D.   On: 10/16/2013 20:12    Microbiology: Recent Results (from the past 240 hour(s))  CULTURE, BLOOD (ROUTINE X 2)     Status: None   Collection Time    10/17/13  1:59 AM      Result Value Ref Range Status   Specimen Description BLOOD RIGHT WRIST   Final   Special Requests     Final   Value: BOTTLES DRAWN AEROBIC AND ANAEROBIC 10CC EACH PATIENT ON FOLLOWING ROCEPHIN 1G AZITHROMYCIN 500MG    Culture  Setup Time     Final   Value: 10/17/2013 08:15     Performed at [facility]   Culture     Final   Value:        BLOOD CULTURE RECEIVED NO GROWTH TO DATE CULTURE WILL BE HELD FOR 5 DAYS BEFORE ISSUING A FINAL NEGATIVE REPORT     Performed at [facility]   Report Status PENDING   Incomplete  CULTURE, BLOOD (ROUTINE X 2)     Status: None   Collection Time    10/17/13  2:06 AM      Result Value Ref Range Status   Specimen Description BLOOD RIGHT HAND   Final   Special Requests     Final   Value: BOTTLES DRAWN AEROBIC ONLY 10CC PATIENT ON  FOLLOWING ROCEPHIN 1G AZITHROMYCIN 500MG    Culture  Setup Time     Final   Value: 10/17/2013 08:15     Performed at [facility]   Culture     Final   Value:        BLOOD CULTURE RECEIVED NO GROWTH TO DATE CULTURE WILL BE HELD FOR 5 DAYS BEFORE ISSUING A FINAL NEGATIVE REPORT     Performed at [facility]   Report Status PENDING   Incomplete  MRSA PCR SCREENING     Status: None   Collection Time    10/17/13  6:48 AM      Result Value Ref Range Status   MRSA by PCR NEGATIVE  NEGATIVE Final   Comment:            The GeneXpert MRSA Assay (FDA     approved for NASAL specimens     only), is one component of a     comprehensive MRSA colonization     surveillance program. It is not     intended to diagnose MRSA     infection nor to guide or     monitor treatment for     MRSA infections.     Labs: Basic Metabolic Panel:  Recent Labs Lab 10/16/13 2055 10/17/13 0815 10/18/13 0930  NA 145 142 144  K 4.1 4.5 3.3*  CL 100 101 98  CO2 33* 31 38*  GLUCOSE 179* 112* 183*  BUN 18 18 16   CREATININE 1.19* 1.11* 0.91  CALCIUM 8.7 8.7 8.9   Liver Function Tests:  Recent Labs Lab 10/16/13 2055  AST 18  ALT 12  ALKPHOS 106  BILITOT <0.2*  PROT 6.5  ALBUMIN 3.0*   No results found for this basename: LIPASE, AMYLASE,  in the last 168 hours No results found for this basename: AMMONIA,  in the last 168 hours CBC:  Recent  Labs Lab 10/16/13 2055 10/17/13 0815 10/18/13 0930  WBC 9.9 12.8* 12.7*  NEUTROABS 7.9*  --   --   HGB 12.6 11.8* 11.6*  HCT 40.1 38.2 37.3  MCV 104.7* 106.1* 105.4*  PLT 181 162 171   Cardiac Enzymes:  Recent Labs Lab 10/16/13 2055  TROPONINI <0.30   BNP: BNP (last 3 results)  Recent Labs  10/16/13 2055  PROBNP 804.7*   CBG:  Recent Labs Lab 10/18/13 1159 10/18/13 1706 10/18/13 2201 10/19/13 0839 10/19/13 1149  GLUCAP 152* 130* 131* 125* 145*       Signed:  ELLIS,ALLISON L. ANP [facility] 10/19/2013,  1:11 PM  I have examined the patient, reviewed the chart and modified the above note which I agree with.   Norely Schlick,MD Pager # on [email or web address] 10/19/2013, 6:25 PM    I have examined the patient, reviewed the chart and modified the above note which I agree with.   Shoua Ressler,MD Pager # on [email or web address] 10/19/2013, 6:24 PM

## 2013-10-19 NOTE — Progress Notes (Signed)
PT Cancellation Note  Patient Details Name: Josie SaundersMildred L Hefter MRN: [id number] DOB: 11/10/1918   Cancelled Treatment:    Reason Eval/Treat Not Completed: Other (comment); daughter reports patient up to bathroom twice this am.  Reports their stairs are short and deep (no way to simulate here).  Also planned d/c today and feels she needs to rest to prep for d/c.  Will defer PT per her request.  Planned PT through Hospice at d/c for education.   Alvera Tourigny,CYNDI 10/19/2013, 12:08 PM

## 2013-10-19 NOTE — Clinical Social Work Note (Cosign Needed)
CSW intern introduce self to patient and patient's daughter, Scarlette CalicoFrances and discussed transportation needs. Scarlette CalicoFrances advised CSW intern that she was good on transportation needs and did not need any further assistance.    Deniece ReeBrianna Egon Dittus, CSW Intern.

## 2013-10-23 LAB — CULTURE, BLOOD (ROUTINE X 2)
CULTURE: NO GROWTH
Culture: NO GROWTH

## 2014-03-13 ENCOUNTER — Inpatient Hospital Stay (HOSPITAL_COMMUNITY)
Admission: EM | Admit: 2014-03-13 | Discharge: 2014-03-15 | DRG: 086 | Disposition: A | Discharge status: Hospice | Attending: Internal Medicine | Admitting: Internal Medicine

## 2014-03-13 ENCOUNTER — Encounter (HOSPITAL_COMMUNITY): Payer: Self-pay | Admitting: Emergency Medicine

## 2014-03-13 ENCOUNTER — Emergency Department (HOSPITAL_COMMUNITY)

## 2014-03-13 DIAGNOSIS — Z7902 Long term (current) use of antithrombotics/antiplatelets: Secondary | ICD-10-CM

## 2014-03-13 DIAGNOSIS — S0083XA Contusion of other part of head, initial encounter: Secondary | ICD-10-CM | POA: Diagnosis present

## 2014-03-13 DIAGNOSIS — I509 Heart failure, unspecified: Secondary | ICD-10-CM | POA: Diagnosis present

## 2014-03-13 DIAGNOSIS — F329 Major depressive disorder, single episode, unspecified: Secondary | ICD-10-CM

## 2014-03-13 DIAGNOSIS — E119 Type 2 diabetes mellitus without complications: Secondary | ICD-10-CM | POA: Diagnosis present

## 2014-03-13 DIAGNOSIS — R197 Diarrhea, unspecified: Secondary | ICD-10-CM | POA: Diagnosis present

## 2014-03-13 DIAGNOSIS — S065X9A Traumatic subdural hemorrhage with loss of consciousness of unspecified duration, initial encounter: Secondary | ICD-10-CM | POA: Diagnosis present

## 2014-03-13 DIAGNOSIS — I6200 Nontraumatic subdural hemorrhage, unspecified: Secondary | ICD-10-CM

## 2014-03-13 DIAGNOSIS — I5022 Chronic systolic (congestive) heart failure: Secondary | ICD-10-CM | POA: Diagnosis present

## 2014-03-13 DIAGNOSIS — I10 Essential (primary) hypertension: Secondary | ICD-10-CM | POA: Diagnosis present

## 2014-03-13 DIAGNOSIS — Z5189 Encounter for other specified aftercare: Secondary | ICD-10-CM

## 2014-03-13 DIAGNOSIS — F0390 Unspecified dementia without behavioral disturbance: Secondary | ICD-10-CM | POA: Diagnosis present

## 2014-03-13 DIAGNOSIS — W06XXXA Fall from bed, initial encounter: Secondary | ICD-10-CM | POA: Diagnosis present

## 2014-03-13 DIAGNOSIS — Z8673 Personal history of transient ischemic attack (TIA), and cerebral infarction without residual deficits: Secondary | ICD-10-CM | POA: Diagnosis not present

## 2014-03-13 DIAGNOSIS — S0083XD Contusion of other part of head, subsequent encounter: Secondary | ICD-10-CM

## 2014-03-13 DIAGNOSIS — S1093XA Contusion of unspecified part of neck, initial encounter: Secondary | ICD-10-CM

## 2014-03-13 DIAGNOSIS — Z66 Do not resuscitate: Secondary | ICD-10-CM | POA: Diagnosis present

## 2014-03-13 DIAGNOSIS — S065X0A Traumatic subdural hemorrhage without loss of consciousness, initial encounter: Secondary | ICD-10-CM | POA: Diagnosis present

## 2014-03-13 DIAGNOSIS — S0003XA Contusion of scalp, initial encounter: Secondary | ICD-10-CM | POA: Diagnosis present

## 2014-03-13 DIAGNOSIS — Y92009 Unspecified place in unspecified non-institutional (private) residence as the place of occurrence of the external cause: Secondary | ICD-10-CM

## 2014-03-13 DIAGNOSIS — F3289 Other specified depressive episodes: Secondary | ICD-10-CM

## 2014-03-13 DIAGNOSIS — S01501A Unspecified open wound of lip, initial encounter: Secondary | ICD-10-CM | POA: Diagnosis present

## 2014-03-13 DIAGNOSIS — I6350 Cerebral infarction due to unspecified occlusion or stenosis of unspecified cerebral artery: Secondary | ICD-10-CM | POA: Diagnosis present

## 2014-03-13 DIAGNOSIS — S01511A Laceration without foreign body of lip, initial encounter: Secondary | ICD-10-CM | POA: Diagnosis present

## 2014-03-13 DIAGNOSIS — F341 Dysthymic disorder: Secondary | ICD-10-CM | POA: Diagnosis present

## 2014-03-13 DIAGNOSIS — S065XAA Traumatic subdural hemorrhage with loss of consciousness status unknown, initial encounter: Secondary | ICD-10-CM | POA: Diagnosis present

## 2014-03-13 LAB — BASIC METABOLIC PANEL
Anion gap: 9 (ref 5–15)
BUN: 33 mg/dL — AB (ref 6–23)
CALCIUM: 9 mg/dL (ref 8.4–10.5)
CO2: 32 mEq/L (ref 19–32)
Chloride: 102 mEq/L (ref 96–112)
Creatinine, Ser: 1.27 mg/dL — ABNORMAL HIGH (ref 0.50–1.10)
GFR calc non Af Amer: 36 mL/min — ABNORMAL LOW (ref >90)
GFR, EST AFRICAN AMERICAN: 41 mL/min — AB (ref >90)
GLUCOSE: 102 mg/dL — AB (ref 70–99)
Potassium: 4.4 mEq/L (ref 3.7–5.3)
SODIUM: 143 meq/L (ref 137–147)

## 2014-03-13 LAB — CBC WITH DIFFERENTIAL/PLATELET
Basophils Absolute: 0 10*3/uL (ref 0.0–0.1)
Basophils Relative: 0 % (ref 0–1)
EOS ABS: 0.3 10*3/uL (ref 0.0–0.7)
EOS PCT: 3 % (ref 0–5)
HCT: 38.1 % (ref 36.0–46.0)
Hemoglobin: 12.1 g/dL (ref 12.0–15.0)
LYMPHS ABS: 1.7 10*3/uL (ref 0.7–4.0)
Lymphocytes Relative: 15 % (ref 12–46)
MCH: 33.4 pg (ref 26.0–34.0)
MCHC: 31.8 g/dL (ref 30.0–36.0)
MCV: 105.2 fL — ABNORMAL HIGH (ref 78.0–100.0)
MONOS PCT: 4 % (ref 3–12)
Monocytes Absolute: 0.4 10*3/uL (ref 0.1–1.0)
Neutro Abs: 8.6 10*3/uL — ABNORMAL HIGH (ref 1.7–7.7)
Neutrophils Relative %: 78 % — ABNORMAL HIGH (ref 43–77)
PLATELETS: 183 10*3/uL (ref 150–400)
RBC: 3.62 MIL/uL — ABNORMAL LOW (ref 3.87–5.11)
RDW: 14.1 % (ref 11.5–15.5)
WBC: 10.9 10*3/uL — ABNORMAL HIGH (ref 4.0–10.5)

## 2014-03-13 MED ORDER — PROSIGHT PO TABS
1.0000 | ORAL_TABLET | Freq: Every day | ORAL | Status: DC
  Administered 2014-03-13 – 2014-03-15 (×3): 1 via ORAL
  Filled 2014-03-13 (×3): qty 1

## 2014-03-13 MED ORDER — SALINE SPRAY 0.65 % NA SOLN
1.0000 | NASAL | Status: DC | PRN
  Administered 2014-03-13 – 2014-03-14 (×2): 1 via NASAL
  Filled 2014-03-13: qty 44

## 2014-03-13 MED ORDER — ONDANSETRON HCL 4 MG PO TABS: 4.0000 mg | ORAL_TABLET | Freq: Four times a day (QID) | ORAL | Status: DC | PRN

## 2014-03-13 MED ORDER — ONDANSETRON HCL 4 MG/2ML IJ SOLN: 4.0000 mg | Freq: Four times a day (QID) | INTRAMUSCULAR | Status: DC | PRN

## 2014-03-13 MED ORDER — CARBAMAZEPINE 200 MG PO TABS
200.0000 mg | ORAL_TABLET | Freq: Every day | ORAL | Status: DC
  Administered 2014-03-13 – 2014-03-15 (×3): 200 mg via ORAL
  Filled 2014-03-13 (×3): qty 1

## 2014-03-13 MED ORDER — ALPRAZOLAM 0.25 MG PO TABS
0.2500 mg | ORAL_TABLET | Freq: Four times a day (QID) | ORAL | Status: DC | PRN
  Administered 2014-03-13 – 2014-03-14 (×3): 0.25 mg via ORAL
  Filled 2014-03-13 (×3): qty 1

## 2014-03-13 MED ORDER — FUROSEMIDE 80 MG PO TABS
80.0000 mg | ORAL_TABLET | Freq: Every morning | ORAL | Status: DC
  Administered 2014-03-14 – 2014-03-15 (×2): 80 mg via ORAL
  Filled 2014-03-13 (×2): qty 1

## 2014-03-13 MED ORDER — CITALOPRAM HYDROBROMIDE 10 MG PO TABS
20.0000 mg | ORAL_TABLET | Freq: Every day | ORAL | Status: DC
  Administered 2014-03-14 – 2014-03-15 (×2): 20 mg via ORAL
  Filled 2014-03-13 (×3): qty 2

## 2014-03-13 MED ORDER — SODIUM CHLORIDE 0.9 % IJ SOLN
3.0000 mL | Freq: Two times a day (BID) | INTRAMUSCULAR | Status: DC
  Administered 2014-03-13 – 2014-03-15 (×3): 3 mL via INTRAVENOUS

## 2014-03-13 MED ORDER — DONEPEZIL HCL 5 MG PO TABS
5.0000 mg | ORAL_TABLET | Freq: Every day | ORAL | Status: DC
  Administered 2014-03-13 – 2014-03-14 (×2): 5 mg via ORAL
  Filled 2014-03-13 (×2): qty 1

## 2014-03-13 MED ORDER — ACETAMINOPHEN 650 MG RE SUPP: 650.0000 mg | Freq: Four times a day (QID) | RECTAL | Status: DC | PRN

## 2014-03-13 MED ORDER — BISACODYL 10 MG RE SUPP: 10.0000 mg | Freq: Every day | RECTAL | Status: DC | PRN

## 2014-03-13 MED ORDER — TRAMADOL HCL 50 MG PO TABS: 50.0000 mg | ORAL_TABLET | Freq: Four times a day (QID) | ORAL | Status: DC | PRN

## 2014-03-13 MED ORDER — POTASSIUM CHLORIDE ER 10 MEQ PO TBCR
10.0000 meq | EXTENDED_RELEASE_TABLET | Freq: Every morning | ORAL | Status: DC
  Administered 2014-03-14 – 2014-03-15 (×2): 10 meq via ORAL
  Filled 2014-03-13 (×4): qty 1

## 2014-03-13 MED ORDER — ACETAMINOPHEN 325 MG PO TABS: 650.0000 mg | ORAL_TABLET | Freq: Four times a day (QID) | ORAL | Status: DC | PRN

## 2014-03-13 MED ORDER — DILTIAZEM HCL ER BEADS 120 MG PO CP24
120.0000 mg | ORAL_CAPSULE | Freq: Every morning | ORAL | Status: DC
  Administered 2014-03-14 – 2014-03-15 (×2): 120 mg via ORAL
  Filled 2014-03-13 (×3): qty 1

## 2014-03-13 NOTE — ED Provider Notes (Signed)
CSN: [id number]     Arrival date & time 03/13/14  0818 History   First MD Initiated Contact with Patient 03/13/14 [phone]     Chief Complaint  Patient presents with  . Facial Laceration   HPI The patient presents to the emergency room with complaints of a facial laceration. History was provided by the patient and the patient's family.  Patient has history of dementia. The family has to watch her very closely. She has difficulty walking on her own. She sleeps in a bed that has bed rails. This morning the patient attempted to get out of bed on her own. Her husband was downstairs making her breakfast the patient slid out of the bed and hit her face on the railing and the floor. She did not lose consciousness.  Patient had bleeding coming from her nose as well as the top of her mouth. EMS was called.  The patient denies any trouble with headache or chest pain at this time. She denies any abdominal pain. She has not had any vomiting or diarrhea. She appears to be at her usual baseline. She is alert and oriented to questions. She is normally disoriented.  Past Medical History  Diagnosis Date  . CHF (congestive heart failure)   . Stroke   . Dementia   . Diabetes mellitus without complication    Past Surgical History  Procedure Laterality Date  . Cholecystectomy     No family history on file. History  Substance Use Topics  . Smoking status: Never Smoker   . Smokeless tobacco: Never Used  . Alcohol Use: No   OB History   Grav Para Term Preterm Abortions TAB SAB Ect Mult Living                 Review of Systems  All other systems reviewed and are negative.     Allergies  Captopril; Codeine; and Sertraline hcl  Home Medications   Prior to Admission medications   Medication Sig Start Date End Date Taking? Authorizing Provider  ALPRAZolam (XANAX) 0.25 MG tablet Take 0.125 mg by mouth at bedtime.    Historical Provider, MD  carbamazepine (TEGRETOL) 200 MG tablet Take 200 mg by mouth at  bedtime.    Historical Provider, MD  clopidogrel (PLAVIX) 75 MG tablet Take 75 mg by mouth daily with breakfast.    Historical Provider, MD  diltiazem (TIAZAC) 120 MG 24 hr capsule Take 120 mg by mouth every morning.     Historical Provider, MD  donepezil (ARICEPT) 10 MG tablet Take 10 mg by mouth at bedtime.    Historical Provider, MD  furosemide (LASIX) 80 MG tablet Take 80 mg by mouth every morning.     Historical Provider, MD  ibuprofen (ADVIL,MOTRIN) 200 MG tablet Take 400 mg by mouth daily.    Historical Provider, MD  Multiple Vitamins-Minerals (PRESERVISION AREDS) TABS Take 1 tablet by mouth daily.    Historical Provider, MD  potassium chloride (K-DUR) 10 MEQ tablet Take 10 mEq by mouth every morning.     Historical Provider, MD   BP 156/63  Pulse 72  Temp(Src) 98 F (36.7 C) (Axillary)  Resp 23  SpO2 99% Physical Exam  Nursing note and vitals reviewed. Constitutional: No distress.  Elderly  HENT:  Head: Normocephalic.  Right Ear: External ear normal.  Left Ear: External ear normal.  Small oblique laceration of the midline upper lip approximately half centimeter, nasal edema, dried blood around the left nares, no septal hematoma, blood in  the oropharynx, no malocclusion or loose dentition, possible small chip fractures of the upper canines and incisors  Eyes: Conjunctivae are normal. Right eye exhibits no discharge. Left eye exhibits no discharge. No scleral icterus.  Neck: Neck supple. No tracheal deviation present.  No cervical spine tenderness  Cardiovascular: Normal rate, regular rhythm and intact distal pulses.   Pulmonary/Chest: Effort normal and breath sounds normal. No stridor. No respiratory distress. She has no wheezes. She has no rales.  Abdominal: Soft. Bowel sounds are normal. She exhibits no distension. There is no tenderness. There is no rebound and no guarding.  Musculoskeletal: She exhibits no edema and no tenderness.  Several areas of ecchymoses noted on her  limbs, no tenderness to palpation in all extremities, full range of motion of hips and upper extremities  Neurological: She is alert. She has normal strength. She is disoriented. No cranial nerve deficit (no facial droop, extraocular movements intact, no slurred speech) or sensory deficit. She exhibits normal muscle tone. She displays no seizure activity. Coordination normal. GCS eye subscore is 4. GCS verbal subscore is 4. GCS motor subscore is 6.  Skin: Skin is warm and dry. No rash noted. She is not diaphoretic.  Psychiatric: She has a normal mood and affect.    ED Course  Procedures (including critical care time) CRITICAL CARE Performed by: WRUEA,VWU Total critical care time: 35 Critical care time was exclusive of separately billable procedures and treating other patients. Critical care was necessary to treat or prevent imminent or life-threatening deterioration. Critical care was time spent personally by me on the following activities: development of treatment plan with patient and/or surrogate as well as nursing, discussions with consultants, evaluation of patient's response to treatment, examination of patient, obtaining history from patient or surrogate, ordering and performing treatments and interventions, ordering and review of laboratory studies, ordering and review of radiographic studies, pulse oximetry and re-evaluation of patient's condition.  Labs Review Labs Reviewed  CBC WITH DIFFERENTIAL - Abnormal; Notable for the following:    WBC 10.9 (*)    RBC 3.62 (*)    MCV 105.2 (*)    Neutrophils Relative % 78 (*)    Neutro Abs 8.6 (*)    All other components within normal limits  BASIC METABOLIC PANEL - Abnormal; Notable for the following:    Glucose, Bld 102 (*)    BUN 33 (*)    Creatinine, Ser 1.27 (*)    GFR calc non Af Amer 36 (*)    GFR calc Af Amer 41 (*)    All other components within normal limits    Imaging Review Ct Head Wo Contrast  03/13/2014   CLINICAL  DATA:  Dementia. Slid out of bed. Head pain. Face pain. Neck pain. Anticoagulated, on Plavix.  EXAM: CT HEAD WITHOUT CONTRAST  CT MAXILLOFACIAL WITHOUT CONTRAST  CT CERVICAL SPINE WITHOUT CONTRAST  TECHNIQUE: Multidetector CT imaging of the head, cervical spine, and maxillofacial structures were performed using the standard protocol without intravenous contrast. Multiplanar CT image reconstructions of the cervical spine and maxillofacial structures were also generated.  COMPARISON:  CT head 12/21/2012.  FINDINGS: CT HEAD FINDINGS  Generalized atrophy with chronic microvascular ischemic change. There is a LEFT subfrontal extra-axial hematoma which extends from the midline along the orbital roof towards the middle cranial fossa lateral convexity. Maximum thickness as seen on coronal reformat CT is 8 mm. Along the RIGHT lateral orbital rim near the pterion, there is a the second suspected extra-axial hematoma approximately 5 mm  thick. No definite subarachnoid blood. No midline shift. Probable mild elevation of the frontal lobes. Vascular calcification. Slight LEFT frontotemporal scalp hematoma. No skull fracture.  CT MAXILLOFACIAL FINDINGS  No sinus air-fluid level or facial fracture. TMJs are located. Extra-axial hematomas along the orbital roof anterior frontal region LEFT greater than RIGHT are demonstrated and discussed on prior CT. BILATERAL cataract extraction. LEFT facial and lip soft tissue swelling/laceration without posttraumatic missing teeth. Probable nasal cavity hematoma on the LEFT.  CT CERVICAL SPINE FINDINGS  There is no visible cervical spine fracture, traumatic subluxation, prevertebral soft tissue swelling, or intraspinal hematoma. Facet mediated slip C7-T1 of 2-3 mm. There is advanced disc space narrowing. Moderate atherosclerosis. Biapical scarring. No neck masses.  IMPRESSION: BILATERAL acute subfrontal LEFT greater than RIGHT extra-axial hematomas. These are likely subdural hematomas although an  epidural hematoma could have this appearance. Maximum thickness on the LEFT is 8 mm.  No parenchymal hemorrhage, skull fracture, or significant midline shift.  No facial fracture. Slight LEFT frontotemporal scalp hematoma. LEFT facial laceration.  Cervical spondylosis.  Findings discussed with ordering provider.   Electronically Signed   By: Davonna Belling M.D.   On: 03/13/2014 09:37   Ct Cervical Spine Wo Contrast  03/13/2014   CLINICAL DATA:  Dementia. Slid out of bed. Head pain. Face pain. Neck pain. Anticoagulated, on Plavix.  EXAM: CT HEAD WITHOUT CONTRAST  CT MAXILLOFACIAL WITHOUT CONTRAST  CT CERVICAL SPINE WITHOUT CONTRAST  TECHNIQUE: Multidetector CT imaging of the head, cervical spine, and maxillofacial structures were performed using the standard protocol without intravenous contrast. Multiplanar CT image reconstructions of the cervical spine and maxillofacial structures were also generated.  COMPARISON:  CT head 12/21/2012.  FINDINGS: CT HEAD FINDINGS  Generalized atrophy with chronic microvascular ischemic change. There is a LEFT subfrontal extra-axial hematoma which extends from the midline along the orbital roof towards the middle cranial fossa lateral convexity. Maximum thickness as seen on coronal reformat CT is 8 mm. Along the RIGHT lateral orbital rim near the pterion, there is a the second suspected extra-axial hematoma approximately 5 mm thick. No definite subarachnoid blood. No midline shift. Probable mild elevation of the frontal lobes. Vascular calcification. Slight LEFT frontotemporal scalp hematoma. No skull fracture.  CT MAXILLOFACIAL FINDINGS  No sinus air-fluid level or facial fracture. TMJs are located. Extra-axial hematomas along the orbital roof anterior frontal region LEFT greater than RIGHT are demonstrated and discussed on prior CT. BILATERAL cataract extraction. LEFT facial and lip soft tissue swelling/laceration without posttraumatic missing teeth. Probable nasal cavity hematoma  on the LEFT.  CT CERVICAL SPINE FINDINGS  There is no visible cervical spine fracture, traumatic subluxation, prevertebral soft tissue swelling, or intraspinal hematoma. Facet mediated slip C7-T1 of 2-3 mm. There is advanced disc space narrowing. Moderate atherosclerosis. Biapical scarring. No neck masses.  IMPRESSION: BILATERAL acute subfrontal LEFT greater than RIGHT extra-axial hematomas. These are likely subdural hematomas although an epidural hematoma could have this appearance. Maximum thickness on the LEFT is 8 mm.  No parenchymal hemorrhage, skull fracture, or significant midline shift.  No facial fracture. Slight LEFT frontotemporal scalp hematoma. LEFT facial laceration.  Cervical spondylosis.  Findings discussed with ordering provider.   Electronically Signed   By: Davonna Belling M.D.   On: 03/13/2014 09:37   Dg Hand Complete Left  03/13/2014   CLINICAL DATA:  Pain post trauma  EXAM: LEFT HAND - COMPLETE 3+ VIEW  COMPARISON:  None.  FINDINGS: Frontal, oblique, and lateral views were obtained. There is no  fracture or dislocation. There is osteoarthritic change in all MCP, PIP, and DIP joints. There is advanced osteoarthritic change in saddle joint. There is moderate osteoarthritic change in the scaphotrapezial joint. There is mild narrowing of the radiocarpal joint. There is no erosive change. Bones appear osteoporotic.  IMPRESSION: Multifocal osteoarthritic change. Bones appear osteoporotic. No demonstrable fracture or dislocation.   Electronically Signed   By: Bretta Bang M.D.   On: 03/13/2014 09:43   Dg Hand Complete Right  03/13/2014   CLINICAL DATA:  Pain post trauma  EXAM: RIGHT HAND - COMPLETE 3+ VIEW  COMPARISON:  None.  FINDINGS: Frontal, oblique, and lateral views were obtained. Bones are diffusely osteoporotic. There is no demonstrable fracture or dislocation. There is osteoarthritic change in all PIP and DIP joints. There is osteoarthritic change in the first, second, and third MCP  joints. There is moderately severe osteoarthritic change in the saddle joint. There is moderate osteoarthritic change in the radiocarpal and scaphotrapezial joints. Calcification in the fifth PIP joint is felt to be of arthropathic etiology. No erosive change.  IMPRESSION: Multifocal osteoarthritic change. Bones are diffusely os osteoporotic. No fracture or dislocation.   Electronically Signed   By: Bretta Bang M.D.   On: 03/13/2014 09:44   Ct Maxillofacial Wo Cm  03/13/2014   CLINICAL DATA:  Dementia. Slid out of bed. Head pain. Face pain. Neck pain. Anticoagulated, on Plavix.  EXAM: CT HEAD WITHOUT CONTRAST  CT MAXILLOFACIAL WITHOUT CONTRAST  CT CERVICAL SPINE WITHOUT CONTRAST  TECHNIQUE: Multidetector CT imaging of the head, cervical spine, and maxillofacial structures were performed using the standard protocol without intravenous contrast. Multiplanar CT image reconstructions of the cervical spine and maxillofacial structures were also generated.  COMPARISON:  CT head 12/21/2012.  FINDINGS: CT HEAD FINDINGS  Generalized atrophy with chronic microvascular ischemic change. There is a LEFT subfrontal extra-axial hematoma which extends from the midline along the orbital roof towards the middle cranial fossa lateral convexity. Maximum thickness as seen on coronal reformat CT is 8 mm. Along the RIGHT lateral orbital rim near the pterion, there is a the second suspected extra-axial hematoma approximately 5 mm thick. No definite subarachnoid blood. No midline shift. Probable mild elevation of the frontal lobes. Vascular calcification. Slight LEFT frontotemporal scalp hematoma. No skull fracture.  CT MAXILLOFACIAL FINDINGS  No sinus air-fluid level or facial fracture. TMJs are located. Extra-axial hematomas along the orbital roof anterior frontal region LEFT greater than RIGHT are demonstrated and discussed on prior CT. BILATERAL cataract extraction. LEFT facial and lip soft tissue swelling/laceration without  posttraumatic missing teeth. Probable nasal cavity hematoma on the LEFT.  CT CERVICAL SPINE FINDINGS  There is no visible cervical spine fracture, traumatic subluxation, prevertebral soft tissue swelling, or intraspinal hematoma. Facet mediated slip C7-T1 of 2-3 mm. There is advanced disc space narrowing. Moderate atherosclerosis. Biapical scarring. No neck masses.  IMPRESSION: BILATERAL acute subfrontal LEFT greater than RIGHT extra-axial hematomas. These are likely subdural hematomas although an epidural hematoma could have this appearance. Maximum thickness on the LEFT is 8 mm.  No parenchymal hemorrhage, skull fracture, or significant midline shift.  No facial fracture. Slight LEFT frontotemporal scalp hematoma. LEFT facial laceration.  Cervical spondylosis.  Findings discussed with ordering provider.   Electronically Signed   By: Davonna Belling M.D.   On: 03/13/2014 09:37    While being cleaned the patient did complain of some pain in her right hand. Unable to reproduce that on my exam. MDM   Final diagnoses:  Subdural hematoma  Facial contusion, initial encounter  Lip laceration, initial encounter   Patient's CT scan reveals bilateral acute subfrontal left and right extra-axial hematomas.  I discussed the findings with the family. The patient is DNR/DNI on hospice. They would not want any active interventions.  At this time there does not appear to be any evidence of that line shift or cerebral edema. I did explain to the family that this possibly could get worse.  Patient is not going to want to have any neurosurgical interventions however consult neurosurgery to see if they have any other recommendations, specifically whether there is any indication.  I spoke with Dr Mikal Plane regarding the patient's care.  She is DNR DNI, hospice.  Does not recommend platelets at this time but would obviously hold plavix for now.  Could consider platelets if there are worsening symptoms or change in the family's  wishes.  Lip laceration is minor.  Do not feel that suturing would be beneficial.    Linwood Dibbles, MD 03/13/14 1010

## 2014-03-13 NOTE — ED Notes (Signed)
Hospice Rn at bedside

## 2014-03-13 NOTE — H&P (Signed)
[facility] History and Physical  Annalei Friesz Lopez WUJ:[id number] DOB: [birth year] DOA: 03/13/2014  Referring physician: EDP PCP: Georgianne Fick, MD   Chief Complaint: s/p fall with facial bruising   HPI: Virginia Goodman is a 80 y.o. female with history of CHF, CVA, dementia, diabetes(on no meds) followed by hospice at home who presented with above complaints. The history is obtained from daughter at bedside. She states that patient's husband usually helps get her out of bed but today by the time he came back from the kitchen to help her he found her on the floor. They reported that she had some bleeding from the area of her and top of her mouth. Patient was brought to the ED and imaging studies were done and CT scan of head showed bilateral acute subfrontal left greater than right extra axial hematomas-likely subdural. CT maxillofacial was negative for fractures, and CT of cervical spine did not show any acute findings. Per EDP family stated that they did not want any aggressive measures/surgical intervention>> neurosurgery was consulted and Dr. Mikal Plane did not recommend any surgical measures and also did not recommend repeat CTs since there would be no intervention as per family wishes. She was admitted for further management At the time of my visit patient complaining of headache, denies any nausea or vomiting.   Review of Systems As per history of present illness, otherwise unobtainable.   Past Medical History  Diagnosis Date  . CHF (congestive heart failure)   . Stroke   . Dementia   . Diabetes mellitus without complication    Past Surgical History  Procedure Laterality Date  . Cholecystectomy     Social History:  reports that she has never smoked. She has never used smokeless tobacco. She reports that she does not drink alcohol or use illicit drugs.  Allergies  Allergen Reactions  . Captopril Other (See Comments)    REACTION: unknown  . Codeine Nausea Only  . Sertraline Hcl  Nausea Only    No family history on file.   Prior to Admission medications   Medication Sig Start Date End Date Taking? Authorizing Provider  ALPRAZolam (XANAX) 0.25 MG tablet Take 0.25 mg by mouth every 6 (six) hours as needed for anxiety.    Yes Historical Provider, MD  carbamazepine (TEGRETOL) 200 MG tablet Take 200 mg by mouth daily.    Yes Historical Provider, MD  citalopram (CELEXA) 20 MG tablet Take 20 mg by mouth daily.   Yes Historical Provider, MD  clopidogrel (PLAVIX) 75 MG tablet Take 75 mg by mouth daily with breakfast.   Yes Historical Provider, MD  diltiazem (TIAZAC) 120 MG 24 hr capsule Take 120 mg by mouth every morning.    Yes Historical Provider, MD  donepezil (ARICEPT) 5 MG tablet Take 5 mg by mouth at bedtime.   Yes Historical Provider, MD  furosemide (LASIX) 80 MG tablet Take 80 mg by mouth every morning.    Yes Historical Provider, MD  ibuprofen (ADVIL,MOTRIN) 200 MG tablet Take 400 mg by mouth daily.   Yes Historical Provider, MD  Multiple Vitamins-Minerals (PRESERVISION AREDS) TABS Take 1 tablet by mouth daily.   Yes Historical Provider, MD  potassium chloride (K-DUR) 10 MEQ tablet Take 10 mEq by mouth every morning.    Yes Historical Provider, MD   Physical Exam: Filed Vitals:   03/13/14 1639  BP: 140/53  Pulse: 82  Temp: 97.8 F (36.6 C)  Resp: 22    BP 140/53  Pulse 82  Temp(Src)  97.8 F (36.6 C) (Axillary)  Resp 22  SpO2 85% Constitutional: Vital signs reviewed.  Patient is a well-developed and well-nourished in no acute distress and cooperative with exam. Alert and answers simple questions appropriately.  Head: Bruised, edematous areas around the eyes, nose and upper lip Mouth: no erythema or exudates, MMM Eyes: PERRL, EOMI, conjunctivae normal, No scleral icterus.  Neck: Supple, Trachea midline normal ROM, No JVD, mass, thyromegaly, or carotid bruit present.  Cardiovascular: RRR, S1 normal, S2 normal, no MRG, pulses symmetric and intact  bilaterally Pulmonary/Chest: normal respiratory effort, no crackles or wheezes Abdominal: Soft. Non-tender, non-distended, bowel sounds are normal, no masses, organomegaly, or guarding present.  Extremities: No cyanosis and no edema  Neurological:  cranial nerve II-XII are grossly intact, she moves all extremities grossly, sensory intact to light touch bilaterally.  Skin: Warm, dry and intact. No rash, cyanosis, or clubbing.  Psychiatric: Normal mood and affect.               Labs on Admission:  Basic Metabolic Panel:  Recent Labs Lab 03/13/14 0845  NA 143  K 4.4  CL 102  CO2 32  GLUCOSE 102*  BUN 33*  CREATININE 1.27*  CALCIUM 9.0   Liver Function Tests: No results found for this basename: AST, ALT, ALKPHOS, BILITOT, PROT, ALBUMIN,  in the last 168 hours No results found for this basename: LIPASE, AMYLASE,  in the last 168 hours No results found for this basename: AMMONIA,  in the last 168 hours CBC:  Recent Labs Lab 03/13/14 0845  WBC 10.9*  NEUTROABS 8.6*  HGB 12.1  HCT 38.1  MCV 105.2*  PLT 183   Cardiac Enzymes: No results found for this basename: CKTOTAL, CKMB, CKMBINDEX, TROPONINI,  in the last 168 hours  BNP (last 3 results)  Recent Labs  10/16/13 2055  PROBNP 804.7*   CBG: No results found for this basename: GLUCAP,  in the last 168 hours  Radiological Exams on Admission: Ct Head Wo Contrast  03/13/2014   CLINICAL DATA:  Dementia. Slid out of bed. Head pain. Face pain. Neck pain. Anticoagulated, on Plavix.  EXAM: CT HEAD WITHOUT CONTRAST  CT MAXILLOFACIAL WITHOUT CONTRAST  CT CERVICAL SPINE WITHOUT CONTRAST  TECHNIQUE: Multidetector CT imaging of the head, cervical spine, and maxillofacial structures were performed using the standard protocol without intravenous contrast. Multiplanar CT image reconstructions of the cervical spine and maxillofacial structures were also generated.  COMPARISON:  CT head 12/21/2012.  FINDINGS: CT HEAD FINDINGS   Generalized atrophy with chronic microvascular ischemic change. There is a LEFT subfrontal extra-axial hematoma which extends from the midline along the orbital roof towards the middle cranial fossa lateral convexity. Maximum thickness as seen on coronal reformat CT is 8 mm. Along the RIGHT lateral orbital rim near the pterion, there is a the second suspected extra-axial hematoma approximately 5 mm thick. No definite subarachnoid blood. No midline shift. Probable mild elevation of the frontal lobes. Vascular calcification. Slight LEFT frontotemporal scalp hematoma. No skull fracture.  CT MAXILLOFACIAL FINDINGS  No sinus air-fluid level or facial fracture. TMJs are located. Extra-axial hematomas along the orbital roof anterior frontal region LEFT greater than RIGHT are demonstrated and discussed on prior CT. BILATERAL cataract extraction. LEFT facial and lip soft tissue swelling/laceration without posttraumatic missing teeth. Probable nasal cavity hematoma on the LEFT.  CT CERVICAL SPINE FINDINGS  There is no visible cervical spine fracture, traumatic subluxation, prevertebral soft tissue swelling, or intraspinal hematoma. Facet mediated slip C7-T1 of 2-3  mm. There is advanced disc space narrowing. Moderate atherosclerosis. Biapical scarring. No neck masses.  IMPRESSION: BILATERAL acute subfrontal LEFT greater than RIGHT extra-axial hematomas. These are likely subdural hematomas although an epidural hematoma could have this appearance. Maximum thickness on the LEFT is 8 mm.  No parenchymal hemorrhage, skull fracture, or significant midline shift.  No facial fracture. Slight LEFT frontotemporal scalp hematoma. LEFT facial laceration.  Cervical spondylosis.  Findings discussed with ordering provider.   Electronically Signed   By: Davonna Belling M.D.   On: 03/13/2014 09:37   Ct Cervical Spine Wo Contrast  03/13/2014   CLINICAL DATA:  Dementia. Slid out of bed. Head pain. Face pain. Neck pain. Anticoagulated, on Plavix.   EXAM: CT HEAD WITHOUT CONTRAST  CT MAXILLOFACIAL WITHOUT CONTRAST  CT CERVICAL SPINE WITHOUT CONTRAST  TECHNIQUE: Multidetector CT imaging of the head, cervical spine, and maxillofacial structures were performed using the standard protocol without intravenous contrast. Multiplanar CT image reconstructions of the cervical spine and maxillofacial structures were also generated.  COMPARISON:  CT head 12/21/2012.  FINDINGS: CT HEAD FINDINGS  Generalized atrophy with chronic microvascular ischemic change. There is a LEFT subfrontal extra-axial hematoma which extends from the midline along the orbital roof towards the middle cranial fossa lateral convexity. Maximum thickness as seen on coronal reformat CT is 8 mm. Along the RIGHT lateral orbital rim near the pterion, there is a the second suspected extra-axial hematoma approximately 5 mm thick. No definite subarachnoid blood. No midline shift. Probable mild elevation of the frontal lobes. Vascular calcification. Slight LEFT frontotemporal scalp hematoma. No skull fracture.  CT MAXILLOFACIAL FINDINGS  No sinus air-fluid level or facial fracture. TMJs are located. Extra-axial hematomas along the orbital roof anterior frontal region LEFT greater than RIGHT are demonstrated and discussed on prior CT. BILATERAL cataract extraction. LEFT facial and lip soft tissue swelling/laceration without posttraumatic missing teeth. Probable nasal cavity hematoma on the LEFT.  CT CERVICAL SPINE FINDINGS  There is no visible cervical spine fracture, traumatic subluxation, prevertebral soft tissue swelling, or intraspinal hematoma. Facet mediated slip C7-T1 of 2-3 mm. There is advanced disc space narrowing. Moderate atherosclerosis. Biapical scarring. No neck masses.  IMPRESSION: BILATERAL acute subfrontal LEFT greater than RIGHT extra-axial hematomas. These are likely subdural hematomas although an epidural hematoma could have this appearance. Maximum thickness on the LEFT is 8 mm.  No  parenchymal hemorrhage, skull fracture, or significant midline shift.  No facial fracture. Slight LEFT frontotemporal scalp hematoma. LEFT facial laceration.  Cervical spondylosis.  Findings discussed with ordering provider.   Electronically Signed   By: Davonna Belling M.D.   On: 03/13/2014 09:37   Dg Hand Complete Left  03/13/2014   CLINICAL DATA:  Pain post trauma  EXAM: LEFT HAND - COMPLETE 3+ VIEW  COMPARISON:  None.  FINDINGS: Frontal, oblique, and lateral views were obtained. There is no fracture or dislocation. There is osteoarthritic change in all MCP, PIP, and DIP joints. There is advanced osteoarthritic change in saddle joint. There is moderate osteoarthritic change in the scaphotrapezial joint. There is mild narrowing of the radiocarpal joint. There is no erosive change. Bones appear osteoporotic.  IMPRESSION: Multifocal osteoarthritic change. Bones appear osteoporotic. No demonstrable fracture or dislocation.   Electronically Signed   By: Bretta Bang M.D.   On: 03/13/2014 09:43   Dg Hand Complete Right  03/13/2014   CLINICAL DATA:  Pain post trauma  EXAM: RIGHT HAND - COMPLETE 3+ VIEW  COMPARISON:  None.  FINDINGS: Frontal, oblique, and  lateral views were obtained. Bones are diffusely osteoporotic. There is no demonstrable fracture or dislocation. There is osteoarthritic change in all PIP and DIP joints. There is osteoarthritic change in the first, second, and third MCP joints. There is moderately severe osteoarthritic change in the saddle joint. There is moderate osteoarthritic change in the radiocarpal and scaphotrapezial joints. Calcification in the fifth PIP joint is felt to be of arthropathic etiology. No erosive change.  IMPRESSION: Multifocal osteoarthritic change. Bones are diffusely os osteoporotic. No fracture or dislocation.   Electronically Signed   By: Bretta Bang M.D.   On: 03/13/2014 09:44   Ct Maxillofacial Wo Cm  03/13/2014   CLINICAL DATA:  Dementia. Slid out of bed.  Head pain. Face pain. Neck pain. Anticoagulated, on Plavix.  EXAM: CT HEAD WITHOUT CONTRAST  CT MAXILLOFACIAL WITHOUT CONTRAST  CT CERVICAL SPINE WITHOUT CONTRAST  TECHNIQUE: Multidetector CT imaging of the head, cervical spine, and maxillofacial structures were performed using the standard protocol without intravenous contrast. Multiplanar CT image reconstructions of the cervical spine and maxillofacial structures were also generated.  COMPARISON:  CT head 12/21/2012.  FINDINGS: CT HEAD FINDINGS  Generalized atrophy with chronic microvascular ischemic change. There is a LEFT subfrontal extra-axial hematoma which extends from the midline along the orbital roof towards the middle cranial fossa lateral convexity. Maximum thickness as seen on coronal reformat CT is 8 mm. Along the RIGHT lateral orbital rim near the pterion, there is a the second suspected extra-axial hematoma approximately 5 mm thick. No definite subarachnoid blood. No midline shift. Probable mild elevation of the frontal lobes. Vascular calcification. Slight LEFT frontotemporal scalp hematoma. No skull fracture.  CT MAXILLOFACIAL FINDINGS  No sinus air-fluid level or facial fracture. TMJs are located. Extra-axial hematomas along the orbital roof anterior frontal region LEFT greater than RIGHT are demonstrated and discussed on prior CT. BILATERAL cataract extraction. LEFT facial and lip soft tissue swelling/laceration without posttraumatic missing teeth. Probable nasal cavity hematoma on the LEFT.  CT CERVICAL SPINE FINDINGS  There is no visible cervical spine fracture, traumatic subluxation, prevertebral soft tissue swelling, or intraspinal hematoma. Facet mediated slip C7-T1 of 2-3 mm. There is advanced disc space narrowing. Moderate atherosclerosis. Biapical scarring. No neck masses.  IMPRESSION: BILATERAL acute subfrontal LEFT greater than RIGHT extra-axial hematomas. These are likely subdural hematomas although an epidural hematoma could have this  appearance. Maximum thickness on the LEFT is 8 mm.  No parenchymal hemorrhage, skull fracture, or significant midline shift.  No facial fracture. Slight LEFT frontotemporal scalp hematoma. LEFT facial laceration.  Cervical spondylosis.  Findings discussed with ordering provider.   Electronically Signed   By: Davonna Belling M.D.   On: 03/13/2014 09:37      Assessment/Plan Active Problems:   Subdural hematoma -As discussed above, will hold Plavix -Neurosurgery consulted per EDP>> Dr. Mikal Plane does not recommend repeat CT as above as family does not desire any aggressive measures/interventions, also he states he does not recommend any Platelet transfusion. -Pain management -Use SCDs for DVT, avoid antiplatelet/anticoagulant   Facial contusion/Lip laceration -Symptom management/supportive care and follow   Dementia -Continue outpatient medications   DIABETES-TYPE 2   ANXIETY DEPRESSION -Continue outpatient medications   HYPERTENSION, BENIGN ESSENTIAL -Continue outpatient medications   CONGESTIVE HEART FAILURE -Compensated, continue outpatient medications   CVA -Plavix on hold secondary to above CODE STATUS -She is DNR/DNI, hospice to continue to follow patient     Code Status: DO NOT RESUSCITATE Family Communication: Daughter At bedside Disposition Plan: admit Neuro  tele  Time spent:>17mins  Kela Millin [facility] Pager [phone]

## 2014-03-13 NOTE — Progress Notes (Signed)
ED visit [facility])-Karen Merilynn Finland RN  Pt to ED via EMS after a fall at home. Pt is DNR code. Pt seen at bedside, lying on the Ed stretcher, daughter Scarlette Calico and son Hal present. Pt oriented to family and place, could answer questions about her fall. She knew her husband was fixing breakfast, remembered she had a hair apt tomorrow. Daughter Scarlette Calico stated that "this level of orientation is not normal for her, she is [place] 20 years behind". Bruising and swelling to nose and lips and slight discoloration under the L eye. Scarlette Calico and Hal's understanding is that per the ED MD, Mrs. Sampley has a subdural hematoma, they at this time do not wish to have any interventions done, but are awaiting a neuro consult and expect that Mrs. Gruwell will be admitted. Writer confirmed this with staff RN Toniann Fail. Pt denied pain, still had some oozing blood from lower lip and mouth. Non rebreather mask  has been changed to nasal cannula. Family plans to remain with patient. Emotional support offered. [facility] will continue to follow.  Dayna Barker RN, BSN, [facility] Liaison [phone]

## 2014-03-13 NOTE — Progress Notes (Signed)
MD in to assess pt and new orders received for pt; Ice applied to pt swollen nose and right forearm hematoma. Reported off to incoming RN and pt remains in bed with family at side. Call light within reach. P.Amo Carvel Huskins RN.

## 2014-03-13 NOTE — ED Notes (Signed)
IV team notified for IV access

## 2014-03-13 NOTE — ED Notes (Signed)
IV team at bedside 

## 2014-03-13 NOTE — ED Notes (Signed)
Pt in from home via [facility], per report pt was getting out of bed & slid to floor hitting her face, pt takes Plavix, pt has controlled bleeding from nose & mouth upon arrival to ED, pt on non rebreather upon arrival to ED, pt normally wears 3 L [place], pt hx of dementia, pt follows simple commands, alert to person, place & situation, pt disoriented to date, family at bedside, denies LOC

## 2014-03-13 NOTE — Progress Notes (Addendum)
Pt arrived to the unit via stretcher; A&O x2; follow commands; communicative; vitals taken; placed on telemetry and called in to C[facility]; pt on 4L oxygen nasal cannula; pain assessed; pt oriented to room with unit; call light within reach; daughter at bedside. Awaiting on orders for pt; MD paged; pt has swollen nose which is bruised; bruised lips which is slightly swollen; bruised right forearm with slight hematoma; around pt's eyes appears bruised up too. Will continue to monitor pt quietly. P.Amo Jamaria Amborn RN.

## 2014-03-14 LAB — CBC
HEMATOCRIT: 33.8 % — AB (ref 36.0–46.0)
Hemoglobin: 10.5 g/dL — ABNORMAL LOW (ref 12.0–15.0)
MCH: 32.6 pg (ref 26.0–34.0)
MCHC: 31.1 g/dL (ref 30.0–36.0)
MCV: 105 fL — AB (ref 78.0–100.0)
PLATELETS: 188 10*3/uL (ref 150–400)
RBC: 3.22 MIL/uL — ABNORMAL LOW (ref 3.87–5.11)
RDW: 14.2 % (ref 11.5–15.5)
WBC: 9.5 10*3/uL (ref 4.0–10.5)

## 2014-03-14 LAB — BASIC METABOLIC PANEL
Anion gap: 6 (ref 5–15)
BUN: 25 mg/dL — AB (ref 6–23)
CALCIUM: 9.2 mg/dL (ref 8.4–10.5)
CO2: 33 meq/L — AB (ref 19–32)
Chloride: 105 mEq/L (ref 96–112)
Creatinine, Ser: 1.04 mg/dL (ref 0.50–1.10)
GFR calc Af Amer: 52 mL/min — ABNORMAL LOW (ref >90)
GFR, EST NON AFRICAN AMERICAN: 45 mL/min — AB (ref >90)
GLUCOSE: 93 mg/dL (ref 70–99)
Potassium: 4.3 mEq/L (ref 3.7–5.3)
Sodium: 144 mEq/L (ref 137–147)

## 2014-03-14 LAB — CLOSTRIDIUM DIFFICILE BY PCR: Toxigenic C. Difficile by PCR: NEGATIVE

## 2014-03-14 MED ORDER — ALPRAZOLAM 0.25 MG PO TABS
0.2500 mg | ORAL_TABLET | Freq: Two times a day (BID) | ORAL | Status: DC | PRN
  Administered 2014-03-14: 0.25 mg via ORAL
  Filled 2014-03-14: qty 1

## 2014-03-14 MED ORDER — LOPERAMIDE HCL 2 MG PO CAPS
4.0000 mg | ORAL_CAPSULE | Freq: Once | ORAL | Status: AC
  Administered 2014-03-14: 4 mg via ORAL

## 2014-03-14 MED ORDER — SODIUM CHLORIDE 0.9 % IV SOLN: INTRAVENOUS | Status: DC

## 2014-03-14 MED ORDER — LOPERAMIDE HCL 2 MG PO CAPS
2.0000 mg | ORAL_CAPSULE | ORAL | Status: DC | PRN
  Filled 2014-03-14: qty 1

## 2014-03-14 MED ORDER — WHITE PETROLATUM GEL
Status: AC
  Filled 2014-03-14: qty 5

## 2014-03-14 NOTE — Progress Notes (Signed)
[facility] PROGRESS NOTE  Virginia Goodman ZOX:[id number] DOB: 10/29/1918 DOA: 03/13/2014 PCP: Georgianne Fick, MD  Assessment/Plan: #1. Bilateral subdural hematoma Secondary to mechanical fall. Patient and family do not want any aggressive interventions. Per ED note neurosurgery was contacted and asked patient did not want any surgical interventions it was recommended not to repeat head CT and to monitor. Continue to hold Plavix.   #2 dementia Stable. Continue outpatient medications.  #3 depression/anxiety Continue Celexa. Change Xanax to twice a day when necessary.  #4 hypertension Stable. Continue diltiazem, Lasix.  #5 chronic systolic heart failure Stable. Currently euvolemic. Continue Lasix, diltiazem.  #6 history of CVA Plavix on hold secondary to problem #1.  #7 prophylaxis SCDs for DVT prophylaxis.  Code Status: DNR Family Communication: Updated daughter at bedside. Disposition Plan: Home with hospice when medically stable.   Consultants:  None  Procedures:  CT head and CT C-spine CT maxillofacial 03/13/2014  Antibiotics:  None  HPI/Subjective: Patient given xanax and asleep.  Objective: Filed Vitals:   03/14/14 1801  BP: 147/45  Pulse: 73  Temp: 98.3 F (36.8 C)  Resp: 20    Intake/Output Summary (Last 24 hours) at 03/14/14 1848 Last data filed at 03/14/14 1700  Gross per 24 hour  Intake    600 ml  Output      0 ml  Net    600 ml   There were no vitals filed for this visit.  Exam:   General:  NAD  Cardiovascular: RRR  Respiratory: CTAB  Abdomen: Soft/NT/ND/+BS  Musculoskeletal: No c/c/e  Data Reviewed: Basic Metabolic Panel:  Recent Labs Lab 03/13/14 0845 03/14/14 0620  NA 143 144  K 4.4 4.3  CL 102 105  CO2 32 33*  GLUCOSE 102* 93  BUN 33* 25*  CREATININE 1.27* 1.04  CALCIUM 9.0 9.2   Liver Function Tests: No results found for this basename: AST, ALT, ALKPHOS, BILITOT, PROT, ALBUMIN,  in the last 168  hours No results found for this basename: LIPASE, AMYLASE,  in the last 168 hours No results found for this basename: AMMONIA,  in the last 168 hours CBC:  Recent Labs Lab 03/13/14 0845 03/14/14 0620  WBC 10.9* 9.5  NEUTROABS 8.6*  --   HGB 12.1 10.5*  HCT 38.1 33.8*  MCV 105.2* 105.0*  PLT 183 188   Cardiac Enzymes: No results found for this basename: CKTOTAL, CKMB, CKMBINDEX, TROPONINI,  in the last 168 hours BNP (last 3 results)  Recent Labs  10/16/13 2055  PROBNP 804.7*   CBG: No results found for this basename: GLUCAP,  in the last 168 hours  Recent Results (from the past 240 hour(s))  CLOSTRIDIUM DIFFICILE BY PCR     Status: None   Collection Time    03/14/14 12:08 AM      Result Value Ref Range Status   C difficile by pcr NEGATIVE  NEGATIVE Final     Studies: Ct Head Wo Contrast  03/13/2014   CLINICAL DATA:  Dementia. Slid out of bed. Head pain. Face pain. Neck pain. Anticoagulated, on Plavix.  EXAM: CT HEAD WITHOUT CONTRAST  CT MAXILLOFACIAL WITHOUT CONTRAST  CT CERVICAL SPINE WITHOUT CONTRAST  TECHNIQUE: Multidetector CT imaging of the head, cervical spine, and maxillofacial structures were performed using the standard protocol without intravenous contrast. Multiplanar CT image reconstructions of the cervical spine and maxillofacial structures were also generated.  COMPARISON:  CT head 12/21/2012.  FINDINGS: CT HEAD FINDINGS  Generalized atrophy with chronic microvascular ischemic change. There  is a LEFT subfrontal extra-axial hematoma which extends from the midline along the orbital roof towards the middle cranial fossa lateral convexity. Maximum thickness as seen on coronal reformat CT is 8 mm. Along the RIGHT lateral orbital rim near the pterion, there is a the second suspected extra-axial hematoma approximately 5 mm thick. No definite subarachnoid blood. No midline shift. Probable mild elevation of the frontal lobes. Vascular calcification. Slight LEFT  frontotemporal scalp hematoma. No skull fracture.  CT MAXILLOFACIAL FINDINGS  No sinus air-fluid level or facial fracture. TMJs are located. Extra-axial hematomas along the orbital roof anterior frontal region LEFT greater than RIGHT are demonstrated and discussed on prior CT. BILATERAL cataract extraction. LEFT facial and lip soft tissue swelling/laceration without posttraumatic missing teeth. Probable nasal cavity hematoma on the LEFT.  CT CERVICAL SPINE FINDINGS  There is no visible cervical spine fracture, traumatic subluxation, prevertebral soft tissue swelling, or intraspinal hematoma. Facet mediated slip C7-T1 of 2-3 mm. There is advanced disc space narrowing. Moderate atherosclerosis. Biapical scarring. No neck masses.  IMPRESSION: BILATERAL acute subfrontal LEFT greater than RIGHT extra-axial hematomas. These are likely subdural hematomas although an epidural hematoma could have this appearance. Maximum thickness on the LEFT is 8 mm.  No parenchymal hemorrhage, skull fracture, or significant midline shift.  No facial fracture. Slight LEFT frontotemporal scalp hematoma. LEFT facial laceration.  Cervical spondylosis.  Findings discussed with ordering provider.   Electronically Signed   By: Davonna Belling M.D.   On: 03/13/2014 09:37   Ct Cervical Spine Wo Contrast  03/13/2014   CLINICAL DATA:  Dementia. Slid out of bed. Head pain. Face pain. Neck pain. Anticoagulated, on Plavix.  EXAM: CT HEAD WITHOUT CONTRAST  CT MAXILLOFACIAL WITHOUT CONTRAST  CT CERVICAL SPINE WITHOUT CONTRAST  TECHNIQUE: Multidetector CT imaging of the head, cervical spine, and maxillofacial structures were performed using the standard protocol without intravenous contrast. Multiplanar CT image reconstructions of the cervical spine and maxillofacial structures were also generated.  COMPARISON:  CT head 12/21/2012.  FINDINGS: CT HEAD FINDINGS  Generalized atrophy with chronic microvascular ischemic change. There is a LEFT subfrontal  extra-axial hematoma which extends from the midline along the orbital roof towards the middle cranial fossa lateral convexity. Maximum thickness as seen on coronal reformat CT is 8 mm. Along the RIGHT lateral orbital rim near the pterion, there is a the second suspected extra-axial hematoma approximately 5 mm thick. No definite subarachnoid blood. No midline shift. Probable mild elevation of the frontal lobes. Vascular calcification. Slight LEFT frontotemporal scalp hematoma. No skull fracture.  CT MAXILLOFACIAL FINDINGS  No sinus air-fluid level or facial fracture. TMJs are located. Extra-axial hematomas along the orbital roof anterior frontal region LEFT greater than RIGHT are demonstrated and discussed on prior CT. BILATERAL cataract extraction. LEFT facial and lip soft tissue swelling/laceration without posttraumatic missing teeth. Probable nasal cavity hematoma on the LEFT.  CT CERVICAL SPINE FINDINGS  There is no visible cervical spine fracture, traumatic subluxation, prevertebral soft tissue swelling, or intraspinal hematoma. Facet mediated slip C7-T1 of 2-3 mm. There is advanced disc space narrowing. Moderate atherosclerosis. Biapical scarring. No neck masses.  IMPRESSION: BILATERAL acute subfrontal LEFT greater than RIGHT extra-axial hematomas. These are likely subdural hematomas although an epidural hematoma could have this appearance. Maximum thickness on the LEFT is 8 mm.  No parenchymal hemorrhage, skull fracture, or significant midline shift.  No facial fracture. Slight LEFT frontotemporal scalp hematoma. LEFT facial laceration.  Cervical spondylosis.  Findings discussed with ordering provider.   Electronically  Signed   By: Davonna Belling M.D.   On: 03/13/2014 09:37   Dg Hand Complete Left  03/13/2014   CLINICAL DATA:  Pain post trauma  EXAM: LEFT HAND - COMPLETE 3+ VIEW  COMPARISON:  None.  FINDINGS: Frontal, oblique, and lateral views were obtained. There is no fracture or dislocation. There is  osteoarthritic change in all MCP, PIP, and DIP joints. There is advanced osteoarthritic change in saddle joint. There is moderate osteoarthritic change in the scaphotrapezial joint. There is mild narrowing of the radiocarpal joint. There is no erosive change. Bones appear osteoporotic.  IMPRESSION: Multifocal osteoarthritic change. Bones appear osteoporotic. No demonstrable fracture or dislocation.   Electronically Signed   By: Bretta Bang M.D.   On: 03/13/2014 09:43   Dg Hand Complete Right  03/13/2014   CLINICAL DATA:  Pain post trauma  EXAM: RIGHT HAND - COMPLETE 3+ VIEW  COMPARISON:  None.  FINDINGS: Frontal, oblique, and lateral views were obtained. Bones are diffusely osteoporotic. There is no demonstrable fracture or dislocation. There is osteoarthritic change in all PIP and DIP joints. There is osteoarthritic change in the first, second, and third MCP joints. There is moderately severe osteoarthritic change in the saddle joint. There is moderate osteoarthritic change in the radiocarpal and scaphotrapezial joints. Calcification in the fifth PIP joint is felt to be of arthropathic etiology. No erosive change.  IMPRESSION: Multifocal osteoarthritic change. Bones are diffusely os osteoporotic. No fracture or dislocation.   Electronically Signed   By: Bretta Bang M.D.   On: 03/13/2014 09:44   Ct Maxillofacial Wo Cm  03/13/2014   CLINICAL DATA:  Dementia. Slid out of bed. Head pain. Face pain. Neck pain. Anticoagulated, on Plavix.  EXAM: CT HEAD WITHOUT CONTRAST  CT MAXILLOFACIAL WITHOUT CONTRAST  CT CERVICAL SPINE WITHOUT CONTRAST  TECHNIQUE: Multidetector CT imaging of the head, cervical spine, and maxillofacial structures were performed using the standard protocol without intravenous contrast. Multiplanar CT image reconstructions of the cervical spine and maxillofacial structures were also generated.  COMPARISON:  CT head 12/21/2012.  FINDINGS: CT HEAD FINDINGS  Generalized atrophy with  chronic microvascular ischemic change. There is a LEFT subfrontal extra-axial hematoma which extends from the midline along the orbital roof towards the middle cranial fossa lateral convexity. Maximum thickness as seen on coronal reformat CT is 8 mm. Along the RIGHT lateral orbital rim near the pterion, there is a the second suspected extra-axial hematoma approximately 5 mm thick. No definite subarachnoid blood. No midline shift. Probable mild elevation of the frontal lobes. Vascular calcification. Slight LEFT frontotemporal scalp hematoma. No skull fracture.  CT MAXILLOFACIAL FINDINGS  No sinus air-fluid level or facial fracture. TMJs are located. Extra-axial hematomas along the orbital roof anterior frontal region LEFT greater than RIGHT are demonstrated and discussed on prior CT. BILATERAL cataract extraction. LEFT facial and lip soft tissue swelling/laceration without posttraumatic missing teeth. Probable nasal cavity hematoma on the LEFT.  CT CERVICAL SPINE FINDINGS  There is no visible cervical spine fracture, traumatic subluxation, prevertebral soft tissue swelling, or intraspinal hematoma. Facet mediated slip C7-T1 of 2-3 mm. There is advanced disc space narrowing. Moderate atherosclerosis. Biapical scarring. No neck masses.  IMPRESSION: BILATERAL acute subfrontal LEFT greater than RIGHT extra-axial hematomas. These are likely subdural hematomas although an epidural hematoma could have this appearance. Maximum thickness on the LEFT is 8 mm.  No parenchymal hemorrhage, skull fracture, or significant midline shift.  No facial fracture. Slight LEFT frontotemporal scalp hematoma. LEFT facial laceration.  Cervical  spondylosis.  Findings discussed with ordering provider.   Electronically Signed   By: Davonna Belling M.D.   On: 03/13/2014 09:37    Scheduled Meds: . carbamazepine  200 mg Oral Daily  . citalopram  20 mg Oral Daily  . diltiazem  120 mg Oral q morning - 10a  . donepezil  5 mg Oral QHS  .  furosemide  80 mg Oral q morning - 10a  . multivitamin  1 tablet Oral Daily  . potassium chloride  10 mEq Oral q morning - 10a  . sodium chloride  3 mL Intravenous Q12H  . white petrolatum       Continuous Infusions: . sodium chloride      Principal Problem:   Subdural hematoma Active Problems:   DIABETES-TYPE 2   ANXIETY DEPRESSION   HYPERTENSION, BENIGN ESSENTIAL   CONGESTIVE HEART FAILURE   CVA   Facial contusion   Lip laceration   Dementia    Time spent: 21 MINS    [facility] MD [facility] Pager [phone]. If 7PM-7AM, please contact night-coverage at [email or web address], password [facility] 03/14/2014, 6:48 PM  LOS: 1 day

## 2014-03-14 NOTE — Progress Notes (Signed)
PT Cancellation Note  Patient Details Name: Virginia Goodman MRN: [id number] DOB: 1918-11-19   Cancelled Treatment:    Reason Eval/Treat Not Completed: Other (comment) (pt too lethargic).  Pt is too lethargic to participate with PT at this time.  Max assist to sit EOB.  Will check back later to see if I can mobilize her more when she is more awake. Per hospice RN she just had medication to relax her.    Thanks,    Rollene Rotunda. Shalinda Burkholder, PT, DPT [phone]   03/14/2014, 11:48 AM

## 2014-03-14 NOTE — Progress Notes (Signed)
Inpatient RN visit- Rylen Hou [facility] 4 N Room 21-[facility] RN Visit-Karen Merilynn Finland [facility] Related admission to [facility] diagnosis of Diastolic Heart Failure.   Pt is DNR  code.  Pt seen at bedside. Daughter Scarlette Calico and son Hal present during visit. Per chart review and family report, pt has had several liquid BM's since yesterday. Pt became agitated during visit, trying to get OOB wanting to "go to the bathroom".  Staff tech alerted, Clinical research associate assisted in getting on to bedpan. Liquid stool noted. Pt cleaned and repositioned for comfort. Family back to room, much discussion had regarding choice "not to pursue aggressive treatment" vs DNR status and what that means. Attending Dr. Janee Morn notified of pt's frequent bm's, pt negative for c-diff, immodium ordered and given by staff RN Dannielle Huh, pt also recvd PRN Xanax. PT to evaluate later in the day. Attending Dr. Janee Morn in to see pt and family during visit, at this time the is plan for discharge tomorrow morning, all in agreement. Pt awake and eating lunch at end of visit.  Patient's home medication list and transfer summary in place on shadow chart.   Please call [facility] @ [phone] with any hospice needs.   Thank you. Hansel Starling, RN  [facility]  Hospice Liaison  [phone])

## 2014-03-14 NOTE — Progress Notes (Signed)
OT Cancellation Note  Patient Details Name: Virginia Goodman MRN: [id number] DOB: 30-Aug-1918   Cancelled Treatment:    Reason Eval/Treat Not Completed: Fatigue/lethargy limiting ability to participate (as per PT note)  Jacqualin Shirkey A 03/14/2014, 11:59 AM

## 2014-03-14 NOTE — Evaluation (Signed)
Physical Therapy Evaluation Patient Details Name: Virginia Goodman MRN: [id number] DOB: 1918-09-04 Today's Date: 03/14/2014   History of Present Illness  80 y.o. female admitted to [facility] on 03/13/14 with fall at home.  CT revealed bilateral acute subfrontal left greater than right extra axial hematomas-likely subdural.  Pt is a hospice pt PTA and family is not seeking aggressive interventions.  Pt with significant PMHx of DM, dementia, CHF, and CVA.  Clinical Impression  Pt is significantly weaker than she was PTA (per family walking with supervision household distances with RW).  She is mod assist short distance gait with significant DOE (3/4) and O2 sats in the mid 80s on 4 L O2 [place] during gait.  Pt would need more than just her husband's assist to handle her physically at home right now.  I would recommend SNF level rehab, but son reports family will refuse this option and will want to take her home.  They report they are arranging for 24/7 caregiver.  If this is the case, HHPT through hospice would be recommended to work with pt on getting her walking and leg strength back to baseline.  PT to follow acutely for deficits listed below.       Follow Up Recommendations SNF;Other (comment) (son reports family will refuse, so HHPT through hospice)    Equipment Recommendations  None recommended by PT    Recommendations for Other Services   NA    Precautions / Restrictions Precautions Precautions: Fall      Mobility  Bed Mobility Overal bed mobility: Needs Assistance Bed Mobility: Supine to Sit     Supine to sit: Mod assist     General bed mobility comments: mod assist to support trunk to get to sitting.  Pt using bil upper extremities to pull her trunk up.   Transfers Overall transfer level: Needs assistance Equipment used: Rolling walker (2 wheeled) Transfers: Sit to/from Stand Sit to Stand: Mod assist         General transfer comment: Mod assist to support trunk over weak legs to  get to standing and to anteriorly weight shift body over feet.   Ambulation/Gait Ambulation/Gait assistance: Mod assist Ambulation Distance (Feet): 15 Feet Assistive device: Rolling walker (2 wheeled) Gait Pattern/deviations: Step-through pattern;Shuffle;Trunk flexed Gait velocity: decreased Gait velocity interpretation: <1.8 ft/sec, indicative of risk for recurrent falls General Gait Details: Pt with very unsteady gait.  Several instances of legs and arms giving away requiring mod assist to maintain standing.  Pt is very unsteady and was only able to tolerate short distance gait. DOE 3/4 with gait and O2 sats on 4 L O2 [place] were 85% during gait.       Balance Overall balance assessment: Needs assistance Sitting-balance support: Feet supported;Bilateral upper extremity supported Sitting balance-Leahy Scale: Fair     Standing balance support: Bilateral upper extremity supported Standing balance-Leahy Scale: Poor                               Pertinent Vitals/Pain Pain Assessment: No/denies pain    Home Living Family/patient expects to be discharged to:: Private residence Living Arrangements: Spouse/significant other Available Help at Discharge: Family;Available 24 hours/day;Personal care attendant Type of Home: House Home Access: Stairs to enter Entrance Stairs-Rails: Right;Left;Can reach both Entrance Stairs-Number of Steps: 3 Home Layout: One level Home Equipment: Walker - 2 wheels;Grab bars - toilet;Grab bars - tub/shower;Bedside commode;Walker - 4 wheels      Prior  Function Level of Independence: Needs assistance   Gait / Transfers Assistance Needed: per family pt walked "pretty well" with RW at home.  Supervision with walker inside  ADL's / Homemaking Assistance Needed: hospice aide comes in 2 times per week to help with her bath.  Assist needed other days for sponge bath.             Extremity/Trunk Assessment   Upper Extremity Assessment: Defer to OT  evaluation           Lower Extremity Assessment: Generalized weakness (bil legs 3-/5 generally)      Cervical / Trunk Assessment: Kyphotic  Communication   Communication: HOH  Cognition Arousal/Alertness: Awake/alert Behavior During Therapy: WFL for tasks assessed/performed Overall Cognitive Status: History of cognitive impairments - at baseline                               Assessment/Plan    PT Assessment Patient needs continued PT services  PT Diagnosis Difficulty walking;Abnormality of gait;Generalized weakness   PT Problem List Decreased strength;Decreased activity tolerance;Decreased balance;Decreased mobility;Decreased knowledge of use of DME;Decreased safety awareness  PT Treatment Interventions DME instruction;Gait training;Stair training;Functional mobility training;Therapeutic activities;Therapeutic exercise;Balance training;Neuromuscular re-education;Patient/family education;Modalities   PT Goals (Current goals can be found in the Care Plan section) Acute Rehab PT Goals Patient Stated Goal: to go home PT Goal Formulation: With patient/family Time For Goal Achievement: 03/28/14 Potential to Achieve Goals: Good    Frequency Min 3X/week   Barriers to discharge   Pt lives with an elderly husband.  She would benefit from increased support at home as I cannot imagine he could handle her at this level        End of Session Equipment Utilized During Treatment: Gait belt;Oxygen Activity Tolerance: Patient limited by fatigue Patient left: in chair;with call bell/phone within reach;with chair alarm set;with family/visitor present           Time: 1650 (1610-9604 getting hx earlier today)-1709 PT Time Calculation (min): 19 min   Charges:   PT Evaluation $Initial PT Evaluation Tier I: 1 Procedure PT Treatments $Therapeutic Activity: 8-22 mins        Dashaun Onstott B. Maki Hege, PT, DPT [phone]   03/14/2014, 5:47 PM

## 2014-03-14 NOTE — Progress Notes (Signed)
Pt had total of 4 loose stools in the last 12 hours. C diff sample and protocol started. Will continue to monitor.

## 2014-03-14 NOTE — Progress Notes (Addendum)
NUMEROUS LOOSE STOOLS THIS AM. UPON REVIEW HOSPICE STAFF DECLINED TO MOVE FORWARD ON DC WITH SYMPTOMS PRESENT. CDIFF -. IMMODIUM ORDERED PER MD. NO OTHER STOOLS REPORTED AT THIS TIME. UNABLE TO WORK WITH PT, HAD XANAX AT FAMILY REQUEST SHORTLY BEFORE. Per [facility] URGING PT WILL REMAIN THROUGH NIGHT.

## 2014-03-14 NOTE — Progress Notes (Signed)
[facility] Social Work note Patient is currently receiving hospice services at home. She lives with her husband and has multiple adult children living locally. She has dementia. Patient is DNR. Patient was awake this am and could recognize that LCSW had been to her home. She forgot the details of her fall, yet knows she has hurt herself. Patient appears to be at her baseline of cognitive functioning today. Daughter, Scarlette Calico and son, Hal were at the bedside and offered updates. They hope that patient will be discharged home soon, perhaps today. They are working to fire additional help in the home to prevent further falls, offer care and support to patient and her husband. Support, encouragement offered. Hospice home care team will follow up upon return to home.  Orson Gear, [place]  [phone]

## 2014-03-14 NOTE — Progress Notes (Signed)
Utilization Review Completed.Virginia Goodman T8/26/2015  

## 2014-03-15 DIAGNOSIS — R197 Diarrhea, unspecified: Secondary | ICD-10-CM | POA: Diagnosis present

## 2014-03-15 DIAGNOSIS — F3289 Other specified depressive episodes: Secondary | ICD-10-CM

## 2014-03-15 DIAGNOSIS — F329 Major depressive disorder, single episode, unspecified: Secondary | ICD-10-CM

## 2014-03-15 LAB — CBC
HCT: 35 % — ABNORMAL LOW (ref 36.0–46.0)
HEMOGLOBIN: 10.9 g/dL — AB (ref 12.0–15.0)
MCH: 33.6 pg (ref 26.0–34.0)
MCHC: 31.1 g/dL (ref 30.0–36.0)
MCV: 108 fL — ABNORMAL HIGH (ref 78.0–100.0)
Platelets: 186 10*3/uL (ref 150–400)
RBC: 3.24 MIL/uL — AB (ref 3.87–5.11)
RDW: 14 % (ref 11.5–15.5)
WBC: 10.1 10*3/uL (ref 4.0–10.5)

## 2014-03-15 LAB — BASIC METABOLIC PANEL
Anion gap: 10 (ref 5–15)
BUN: 21 mg/dL (ref 6–23)
CHLORIDE: 104 meq/L (ref 96–112)
CO2: 33 mEq/L — ABNORMAL HIGH (ref 19–32)
CREATININE: 1.02 mg/dL (ref 0.50–1.10)
Calcium: 9.2 mg/dL (ref 8.4–10.5)
GFR calc Af Amer: 54 mL/min — ABNORMAL LOW (ref >90)
GFR, EST NON AFRICAN AMERICAN: 46 mL/min — AB (ref >90)
Glucose, Bld: 98 mg/dL (ref 70–99)
Potassium: 4.7 mEq/L (ref 3.7–5.3)
Sodium: 147 mEq/L (ref 137–147)

## 2014-03-15 MED ORDER — LOPERAMIDE HCL 2 MG PO CAPS: 2.0000 mg | ORAL_CAPSULE | ORAL | Status: AC | PRN

## 2014-03-15 MED ORDER — TRAMADOL HCL 50 MG PO TABS
50.0000 mg | ORAL_TABLET | Freq: Four times a day (QID) | ORAL | Status: AC | PRN
Start: 2014-03-15 — End: ?

## 2014-03-15 NOTE — Discharge Summary (Signed)
Physician Discharge Summary  Virginia Goodman ZOX:[id number] DOB: [birth year] DOA: 03/13/2014  PCP: Georgianne Fick, MD  Admit date: 03/13/2014 Discharge date: 03/15/2014  Time spent: 65 minutes  Recommendations for Outpatient Follow-up:  1. Patient will be discharged home with hospice. 2. Followup with RAMACHANDRAN,AJITH, MD in 1 week. A followup CBC will need to be updated to followup on patient's hemoglobin. Patient's Plavix has been stopped secondary to subdural hematoma.  Discharge Diagnoses:  Principal Problem:   Subdural hematoma Active Problems:   DIABETES-TYPE 2   ANXIETY DEPRESSION   HYPERTENSION, BENIGN ESSENTIAL   CONGESTIVE HEART FAILURE   CVA   Facial contusion   Lip laceration   Dementia   Diarrhea   Discharge Condition: Stable  Diet recommendation: Regular  There were no vitals filed for this visit.  History of present illness:  Virginia Goodman is a 80 y.o. female with history of CHF, CVA, dementia, diabetes(on no meds) followed by hospice at home who presented with above complaints of fall with facial bruising. The history was obtained from daughter at bedside. She stated that patient's husband usually helps get her out of bed but on the day of admission,  by the time he came back from the kitchen to help her he found her on the floor. They reported that she had some bleeding from the area of her and top of her mouth. Patient was brought to the ED and imaging studies were done and CT scan of head showed bilateral acute subfrontal left greater than right extra axial hematomas-likely subdural. CT maxillofacial was negative for fractures, and CT of cervical spine did not show any acute findings. Per EDP family stated that they did not want any aggressive measures/surgical intervention>> neurosurgery was consulted and Dr. Mikal Plane did not recommend any surgical measures and also did not recommend repeat CTs since there would be no intervention as per family wishes. She was  admitted for further management  At the time of admitting physician's visit patient was complaining of headache, denied any nausea or vomiting.   Hospital Course:  #1. Bilateral subdural hematoma  Patient was admitted after a fall after being seen in the emergency room where CT scan of the head showed bilateral acute subfrontal left greater than right extra axial hematomas. CT maxillofacial was negative for fractures and CT of the cervical spine did not show any acute findings. Secondary to mechanical fall. Patient and family on admission expressed the desire of not wanting any aggressive interventions. Per ED note neurosurgery was contacted and as patient did not want any surgical interventions it was recommended not to repeat head CT and to monitor. Patient's Plavix was held. Patient remained in stable condition did not have any neurological deficits and will be discharged home with hospice in stable condition. Patient is to followup with PCP as outpatient. #2 dementia  Stable. Continued on outpatient medications.  #3 depression/anxiety  Remained stable throughout the hospitalization. Patient was maintained on a home regimen of Celexa and Xanax.  #4 hypertension  Stable. Continued on home regimen of diltiazem, Lasix.  #5 diarrhea Patient was noted to have loose stools on admission. C. difficile PCR which was checked was negative. Patient was placed on Imodium as needed and patient's diarrhea resolved.  #6 chronic systolic heart failure  Stable. Currently euvolemic. Continued on home regimen of Lasix, diltiazem.  #7 history of CVA  Plavix on hold secondary to problem #1. Follow up with PCP as outpatient.      Procedures: CT head and  CT C-spine CT maxillofacial 03/13/2014   Consultations:  None  Discharge Exam: Filed Vitals:   03/15/14 1022  BP: 143/59  Pulse: 83  Temp: 97.8 F (36.6 C)  Resp: 18    General: NAD Cardiovascular: RRR Respiratory: CTAB  Discharge  Instructions You were cared for by a hospitalist during your hospital stay. If you have any questions about your discharge medications or the care you received while you were in the hospital after you are discharged, you can call the unit and asked to speak with the hospitalist on call if the hospitalist that took care of you is not available. Once you are discharged, your primary care physician will handle any further medical issues. Please note that NO REFILLS for any discharge medications will be authorized once you are discharged, as it is imperative that you return to your primary care physician (or establish a relationship with a primary care physician if you do not have one) for your aftercare needs so that they can reassess your need for medications and monitor your lab values.      Discharge Instructions   Diet general    Complete by:  As directed      Discharge instructions    Complete by:  As directed   Follow up with [facility], MD in 1 week.     Increase activity slowly    Complete by:  As directed             Medication List    STOP taking these medications       clopidogrel 75 MG tablet  Commonly known as:  PLAVIX      TAKE these medications       ALPRAZolam 0.25 MG tablet  Commonly known as:  XANAX  Take 0.25 mg by mouth every 6 (six) hours as needed for anxiety.     carbamazepine 200 MG tablet  Commonly known as:  TEGRETOL  Take 200 mg by mouth daily.     citalopram 20 MG tablet  Commonly known as:  CELEXA  Take 20 mg by mouth daily.     diltiazem 120 MG 24 hr capsule  Commonly known as:  TIAZAC  Take 120 mg by mouth every morning.     donepezil 5 MG tablet  Commonly known as:  ARICEPT  Take 5 mg by mouth at bedtime.     furosemide 80 MG tablet  Commonly known as:  LASIX  Take 80 mg by mouth every morning.     ibuprofen 200 MG tablet  Commonly known as:  ADVIL,MOTRIN  Take 400 mg by mouth daily.     loperamide 2 MG capsule  Commonly  known as:  IMODIUM  Take 1 capsule (2 mg total) by mouth as needed for diarrhea or loose stools.     potassium chloride 10 MEQ tablet  Commonly known as:  K-DUR  Take 10 mEq by mouth every morning.     PRESERVISION AREDS Tabs  Take 1 tablet by mouth daily.     traMADol 50 MG tablet  Commonly known as:  ULTRAM  Take 1 tablet (50 mg total) by mouth every 6 (six) hours as needed for moderate pain.       Allergies  Allergen Reactions  . Captopril Other (See Comments)    REACTION: unknown  . Codeine Nausea Only  . Sertraline Hcl Nausea Only   Follow-up Information   Follow up with [facility], MD. Schedule an appointment as soon as possible for a visit  in 1 week.   Specialty:  Internal Medicine   Contact information:   [address] [place] [address] [phone]        The results of significant diagnostics from this hospitalization (including imaging, microbiology, ancillary and laboratory) are listed below for reference.    Significant Diagnostic Studies: Ct Head Wo Contrast  03/13/2014   CLINICAL DATA:  Dementia. Slid out of bed. Head pain. Face pain. Neck pain. Anticoagulated, on Plavix.  EXAM: CT HEAD WITHOUT CONTRAST  CT MAXILLOFACIAL WITHOUT CONTRAST  CT CERVICAL SPINE WITHOUT CONTRAST  TECHNIQUE: Multidetector CT imaging of the head, cervical spine, and maxillofacial structures were performed using the standard protocol without intravenous contrast. Multiplanar CT image reconstructions of the cervical spine and maxillofacial structures were also generated.  COMPARISON:  CT head 12/21/2012.  FINDINGS: CT HEAD FINDINGS  Generalized atrophy with chronic microvascular ischemic change. There is a LEFT subfrontal extra-axial hematoma which extends from the midline along the orbital roof towards the middle cranial fossa lateral convexity. Maximum thickness as seen on coronal reformat CT is 8 mm. Along the RIGHT lateral orbital rim near the pterion, there  is a the second suspected extra-axial hematoma approximately 5 mm thick. No definite subarachnoid blood. No midline shift. Probable mild elevation of the frontal lobes. Vascular calcification. Slight LEFT frontotemporal scalp hematoma. No skull fracture.  CT MAXILLOFACIAL FINDINGS  No sinus air-fluid level or facial fracture. TMJs are located. Extra-axial hematomas along the orbital roof anterior frontal region LEFT greater than RIGHT are demonstrated and discussed on prior CT. BILATERAL cataract extraction. LEFT facial and lip soft tissue swelling/laceration without posttraumatic missing teeth. Probable nasal cavity hematoma on the LEFT.  CT CERVICAL SPINE FINDINGS  There is no visible cervical spine fracture, traumatic subluxation, prevertebral soft tissue swelling, or intraspinal hematoma. Facet mediated slip C7-T1 of 2-3 mm. There is advanced disc space narrowing. Moderate atherosclerosis. Biapical scarring. No neck masses.  IMPRESSION: BILATERAL acute subfrontal LEFT greater than RIGHT extra-axial hematomas. These are likely subdural hematomas although an epidural hematoma could have this appearance. Maximum thickness on the LEFT is 8 mm.  No parenchymal hemorrhage, skull fracture, or significant midline shift.  No facial fracture. Slight LEFT frontotemporal scalp hematoma. LEFT facial laceration.  Cervical spondylosis.  Findings discussed with ordering provider.   Electronically Signed   By: Davonna Belling M.D.   On: 03/13/2014 09:37   Ct Cervical Spine Wo Contrast  03/13/2014   CLINICAL DATA:  Dementia. Slid out of bed. Head pain. Face pain. Neck pain. Anticoagulated, on Plavix.  EXAM: CT HEAD WITHOUT CONTRAST  CT MAXILLOFACIAL WITHOUT CONTRAST  CT CERVICAL SPINE WITHOUT CONTRAST  TECHNIQUE: Multidetector CT imaging of the head, cervical spine, and maxillofacial structures were performed using the standard protocol without intravenous contrast. Multiplanar CT image reconstructions of the cervical spine and  maxillofacial structures were also generated.  COMPARISON:  CT head 12/21/2012.  FINDINGS: CT HEAD FINDINGS  Generalized atrophy with chronic microvascular ischemic change. There is a LEFT subfrontal extra-axial hematoma which extends from the midline along the orbital roof towards the middle cranial fossa lateral convexity. Maximum thickness as seen on coronal reformat CT is 8 mm. Along the RIGHT lateral orbital rim near the pterion, there is a the second suspected extra-axial hematoma approximately 5 mm thick. No definite subarachnoid blood. No midline shift. Probable mild elevation of the frontal lobes. Vascular calcification. Slight LEFT frontotemporal scalp hematoma. No skull fracture.  CT MAXILLOFACIAL FINDINGS  No sinus air-fluid level or  facial fracture. TMJs are located. Extra-axial hematomas along the orbital roof anterior frontal region LEFT greater than RIGHT are demonstrated and discussed on prior CT. BILATERAL cataract extraction. LEFT facial and lip soft tissue swelling/laceration without posttraumatic missing teeth. Probable nasal cavity hematoma on the LEFT.  CT CERVICAL SPINE FINDINGS  There is no visible cervical spine fracture, traumatic subluxation, prevertebral soft tissue swelling, or intraspinal hematoma. Facet mediated slip C7-T1 of 2-3 mm. There is advanced disc space narrowing. Moderate atherosclerosis. Biapical scarring. No neck masses.  IMPRESSION: BILATERAL acute subfrontal LEFT greater than RIGHT extra-axial hematomas. These are likely subdural hematomas although an epidural hematoma could have this appearance. Maximum thickness on the LEFT is 8 mm.  No parenchymal hemorrhage, skull fracture, or significant midline shift.  No facial fracture. Slight LEFT frontotemporal scalp hematoma. LEFT facial laceration.  Cervical spondylosis.  Findings discussed with ordering provider.   Electronically Signed   By: Davonna Belling M.D.   On: 03/13/2014 09:37   Dg Hand Complete Left  03/13/2014    CLINICAL DATA:  Pain post trauma  EXAM: LEFT HAND - COMPLETE 3+ VIEW  COMPARISON:  None.  FINDINGS: Frontal, oblique, and lateral views were obtained. There is no fracture or dislocation. There is osteoarthritic change in all MCP, PIP, and DIP joints. There is advanced osteoarthritic change in saddle joint. There is moderate osteoarthritic change in the scaphotrapezial joint. There is mild narrowing of the radiocarpal joint. There is no erosive change. Bones appear osteoporotic.  IMPRESSION: Multifocal osteoarthritic change. Bones appear osteoporotic. No demonstrable fracture or dislocation.   Electronically Signed   By: Bretta Bang M.D.   On: 03/13/2014 09:43   Dg Hand Complete Right  03/13/2014   CLINICAL DATA:  Pain post trauma  EXAM: RIGHT HAND - COMPLETE 3+ VIEW  COMPARISON:  None.  FINDINGS: Frontal, oblique, and lateral views were obtained. Bones are diffusely osteoporotic. There is no demonstrable fracture or dislocation. There is osteoarthritic change in all PIP and DIP joints. There is osteoarthritic change in the first, second, and third MCP joints. There is moderately severe osteoarthritic change in the saddle joint. There is moderate osteoarthritic change in the radiocarpal and scaphotrapezial joints. Calcification in the fifth PIP joint is felt to be of arthropathic etiology. No erosive change.  IMPRESSION: Multifocal osteoarthritic change. Bones are diffusely os osteoporotic. No fracture or dislocation.   Electronically Signed   By: Bretta Bang M.D.   On: 03/13/2014 09:44   Ct Maxillofacial Wo Cm  03/13/2014   CLINICAL DATA:  Dementia. Slid out of bed. Head pain. Face pain. Neck pain. Anticoagulated, on Plavix.  EXAM: CT HEAD WITHOUT CONTRAST  CT MAXILLOFACIAL WITHOUT CONTRAST  CT CERVICAL SPINE WITHOUT CONTRAST  TECHNIQUE: Multidetector CT imaging of the head, cervical spine, and maxillofacial structures were performed using the standard protocol without intravenous contrast.  Multiplanar CT image reconstructions of the cervical spine and maxillofacial structures were also generated.  COMPARISON:  CT head 12/21/2012.  FINDINGS: CT HEAD FINDINGS  Generalized atrophy with chronic microvascular ischemic change. There is a LEFT subfrontal extra-axial hematoma which extends from the midline along the orbital roof towards the middle cranial fossa lateral convexity. Maximum thickness as seen on coronal reformat CT is 8 mm. Along the RIGHT lateral orbital rim near the pterion, there is a the second suspected extra-axial hematoma approximately 5 mm thick. No definite subarachnoid blood. No midline shift. Probable mild elevation of the frontal lobes. Vascular calcification. Slight LEFT frontotemporal scalp hematoma. No skull fracture.  CT MAXILLOFACIAL FINDINGS  No sinus air-fluid level or facial fracture. TMJs are located. Extra-axial hematomas along the orbital roof anterior frontal region LEFT greater than RIGHT are demonstrated and discussed on prior CT. BILATERAL cataract extraction. LEFT facial and lip soft tissue swelling/laceration without posttraumatic missing teeth. Probable nasal cavity hematoma on the LEFT.  CT CERVICAL SPINE FINDINGS  There is no visible cervical spine fracture, traumatic subluxation, prevertebral soft tissue swelling, or intraspinal hematoma. Facet mediated slip C7-T1 of 2-3 mm. There is advanced disc space narrowing. Moderate atherosclerosis. Biapical scarring. No neck masses.  IMPRESSION: BILATERAL acute subfrontal LEFT greater than RIGHT extra-axial hematomas. These are likely subdural hematomas although an epidural hematoma could have this appearance. Maximum thickness on the LEFT is 8 mm.  No parenchymal hemorrhage, skull fracture, or significant midline shift.  No facial fracture. Slight LEFT frontotemporal scalp hematoma. LEFT facial laceration.  Cervical spondylosis.  Findings discussed with ordering provider.   Electronically Signed   By: Davonna Belling M.D.    On: 03/13/2014 09:37    Microbiology: Recent Results (from the past 240 hour(s))  CLOSTRIDIUM DIFFICILE BY PCR     Status: None   Collection Time    03/14/14 12:08 AM      Result Value Ref Range Status   C difficile by pcr NEGATIVE  NEGATIVE Final     Labs: Basic Metabolic Panel:  Recent Labs Lab 03/13/14 0845 03/14/14 0620 03/15/14 0747  NA 143 144 147  K 4.4 4.3 4.7  CL 102 105 104  CO2 32 33* 33*  GLUCOSE 102* 93 98  BUN 33* 25* 21  CREATININE 1.27* 1.04 1.02  CALCIUM 9.0 9.2 9.2   Liver Function Tests: No results found for this basename: AST, ALT, ALKPHOS, BILITOT, PROT, ALBUMIN,  in the last 168 hours No results found for this basename: LIPASE, AMYLASE,  in the last 168 hours No results found for this basename: AMMONIA,  in the last 168 hours CBC:  Recent Labs Lab 03/13/14 0845 03/14/14 0620 03/15/14 0747  WBC 10.9* 9.5 10.1  NEUTROABS 8.6*  --   --   HGB 12.1 10.5* 10.9*  HCT 38.1 33.8* 35.0*  MCV 105.2* 105.0* 108.0*  PLT 183 188 186   Cardiac Enzymes: No results found for this basename: CKTOTAL, CKMB, CKMBINDEX, TROPONINI,  in the last 168 hours BNP: BNP (last 3 results)  Recent Labs  10/16/13 2055  PROBNP 804.7*   CBG: No results found for this basename: GLUCAP,  in the last 168 hours     Signed:  [facility] MD [facility] 03/15/2014, 2:30 PM

## 2014-03-15 NOTE — Progress Notes (Signed)
Patient being prepared for discharge by NT.  Paperwork completed and given to patient's daughter.  IV removed and transportation being arranged via [facility].  Will continue to monitor until departure.  Lance Bosch, RN

## 2014-03-15 NOTE — Clinical Social Work Note (Signed)
Discharge:   CSW met pt and her daughter at bedside. CSW and pt discussed D/C home. CSW arranged transport with [facility].   [place], MSW, [place]

## 2014-03-15 NOTE — Progress Notes (Signed)
Inpatient RN visit- Virginia Goodman [facility] 4N  Room 21-[facility] RN Visit-Virginia Merilynn Finland RN  Related admission to [facility] diagnosis of Diastolic Heart Failure. Pt is DNR code.   Pt seen at bedside, daughter Virginia Goodman and staff RN Virginia Goodman present. Pt appeared to be sleeping, easily awakened when lunch tray arrived. Pt with some swelling noted to her nose and under her eyes, along with purple bruising and abrasion to lips. Pt denied pain. Per Virginia Goodman, Virginia Goodman has been up with assistance to the bathroom and up in the chair for breakfast.Pt has not required any further doses of PRN xanax overnight. Her loose stools responded well to the 1 dose of immodium given yesterday. Plan is for discharge today, via non emergent transport as pt is unable to navigate the stairs into her home. Family in agreement with plan. [facility] team aware of pending discharge. Patient's home medication list and transfer summary in place on shadow chart.   Please call [facility] @ [phone]-  with any hospice needs.   Thank you. Hansel Starling, RN  [facility]  Hospice Liaison  [phone])

## 2014-03-15 NOTE — Evaluation (Signed)
Occupational Therapy Evaluation Patient Details Name: Virginia Goodman MRN: [id number] DOB: July 09, 1919 Today's Date: 03/15/2014    History of Present Illness 80 y.o. female admitted to [facility] on 03/13/14 with fall at home.  CT revealed bilateral acute subfrontal left greater than right extra axial hematomas-likely subdural.  Pt is a hospice pt PTA and family is not seeking aggressive interventions.  Pt with significant PMHx of DM, dementia, CHF, and CVA.   Clinical Impression   PT admitted with s/p fall  SDH . Pt currently with functional limitiations due to the deficits listed below (see OT problem list). PTA pt required family (A) for all adls and transferred supervision level. Pt has hospice 5 days a week. Pt will benefit from skilled OT to increase their independence and safety with adls and balance to allow discharge HHOT and assistance with hiring a home aide. Daughter reports that she or her brother will be with family 24/7 until hired help can be present. Spouse refusing anywhere but home d/c recommendation. Children are arranging hired (A) without notifying spouse. Ot to continue to follow acutely to incr activity tolerance with adls and decr fall risk .     Follow Up Recommendations  Home health OT;Other (comment) (need to information on hiring an Aide to (A))    Equipment Recommendations  None recommended by OT    Recommendations for Other Services       Precautions / Restrictions Precautions Precautions: Fall Precaution Comments: oxygen required Restrictions Weight Bearing Restrictions: No      Mobility Bed Mobility               General bed mobility comments: in chair on arrival  Transfers Overall transfer level: Needs assistance Equipment used: Rolling walker (2 wheeled) Transfers: Sit to/from Stand Sit to Stand: Mod assist         General transfer comment: Pt required (A) to place hands and anterior weight shift for static standing. Daughter reports at  baseline using lift chair to power up from chair.    Balance Overall balance assessment: Needs assistance;History of Falls Sitting-balance support: Bilateral upper extremity supported;Feet supported Sitting balance-Leahy Scale: Fair     Standing balance support: Bilateral upper extremity supported;During functional activity Standing balance-Leahy Scale: Poor                              ADL Overall ADL's : Needs assistance/impaired;At baseline Eating/Feeding: Set up;Sitting Eating/Feeding Details (indicate cue type and reason): pt able to eat breakfast in chair and drink from straw without deficits noted Grooming: Wash/dry face;Set up;Sitting                   Toilet Transfer: Moderate assistance;Ambulation;RW;BSC;Grab bars Toilet Transfer Details (indicate cue type and reason): Pt requires cues for hand placement and attempting to transfer off toilet without (A) Toileting- Clothing Manipulation and Hygiene: Total assistance;Sit to/from stand Toileting - Clothing Manipulation Details (indicate cue type and reason): Pt required total (A) for peri care     Functional mobility during ADLs: Moderate assistance;Rolling walker General ADL Comments: Pt very HOH and needs commands repeated several times. Pt transferred to bathroom and attempting to exit toilet without (A). pt implusive. pt remains high fall risk. daughter requesting informatoin about d/c home with paid (A). Husband unwilling to consent to d/c anywhere but home.     Vision  Perception     Praxis      Pertinent Vitals/Pain Pain Assessment: Faces Faces Pain Scale: Hurts even more Pain Location: BIL knees and feet Pain Intervention(s): Repositioned     Hand Dominance Right   Extremity/Trunk Assessment Upper Extremity Assessment Upper Extremity Assessment: Generalized weakness;RUE deficits/detail RUE Deficits / Details: torn Rotator cuff  hx of ROM deficits  RUE  Coordination: decreased gross motor   Lower Extremity Assessment Lower Extremity Assessment: Defer to PT evaluation   Cervical / Trunk Assessment Cervical / Trunk Assessment: Kyphotic   Communication Communication Communication: HOH   Cognition Arousal/Alertness: Awake/alert Behavior During Therapy: Impulsive Overall Cognitive Status: History of cognitive impairments - at baseline       Memory: Decreased short-term memory             General Comments       Exercises       Shoulder Instructions      Home Living Family/patient expects to be discharged to:: Private residence Living Arrangements: Spouse/significant other Available Help at Discharge: Family;Available 24 hours/day;Personal care attendant Type of Home: House Home Access: Stairs to enter [employer] of Steps: 3 Entrance Stairs-Rails: Right;Left;Can reach both Home Layout: One level     Bathroom Shower/Tub: Producer, television/film/video: Standard     Home Equipment: Walker - 2 wheels;Grab bars - toilet;Grab bars - tub/shower;Bedside commode;Walker - 4 wheels;Toilet riser;Other (comment) (lift chair and bed rail)   Additional Comments: has HH aide 2x a week to give pt bath; pt has walk in shower with handicap rails  and elevated toilet seat       Prior Functioning/Environment Level of Independence: Needs assistance  Gait / Transfers Assistance Needed: per family pt walked "pretty well" with RW at home.  Supervision with walker inside ADL's / Homemaking Assistance Needed: hospice aide comes in 2 times per week to help with her bath.  Assist needed other days for sponge bath.   Communication / Swallowing Assistance Needed: None needed      OT Diagnosis: Generalized weakness;Cognitive deficits;Acute pain   OT Problem List: Decreased strength;Decreased activity tolerance;Impaired balance (sitting and/or standing);Decreased cognition;Decreased safety awareness;Decreased knowledge of use  of DME or AE;Decreased knowledge of precautions;Pain;Obesity   OT Treatment/Interventions: Self-care/ADL training;Therapeutic exercise;DME and/or AE instruction;Therapeutic activities;Cognitive remediation/compensation;Patient/family education;Balance training    OT Goals(Current goals can be found in the care plan section) Acute Rehab OT Goals Patient Stated Goal: to go home OT Goal Formulation: With patient/family Time For Goal Achievement: 03/29/14 Potential to Achieve Goals: Good  OT Frequency: Min 2X/week   Barriers to D/C:            Co-evaluation              End of Session Equipment Utilized During Treatment: Gait belt;Rolling walker Nurse Communication: Mobility status;Precautions  Activity Tolerance: Patient tolerated treatment well Patient left: in chair;with call bell/phone within reach;with family/visitor present   Time: 4098-1191 OT Time Calculation (min): 33 min Charges:  OT General Charges $OT Visit: 1 Procedure OT Evaluation $Initial OT Evaluation Tier I: 1 Procedure OT Treatments $Self Care/Home Management : 23-37 mins G-Codes:    Harolyn Rutherford April 02, 2014, 9:28 AM Pager: [phone]

## 2014-03-15 NOTE — Care Management Note (Addendum)
  Page 1 of 1   03/15/2014     1:49:53 PM CARE MANAGEMENT NOTE 03/15/2014  Patient:  Virginia Goodman, Virginia Goodman   Account Number:  [id number]  Date Initiated:  03/15/2014  Documentation initiated by:  Lorne Skeens  Subjective/Objective Assessment:   Patient was admitted with SDH. Lives at home with spouse. Under the care of [facility] prior to admit     Action/Plan:   Will follow for discharge needs   Anticipated DC Date:  03/15/2014   Anticipated DC Plan:  [place] referral  Clinical Social Worker         Choice offered to / List presented to:             Status of service:  Completed, signed off Medicare Important Message given?   (If response is "NO", the following Medicare IM given date fields will be blank) Date Medicare IM given:   Medicare IM given by:   Date Additional Medicare IM given:   Additional Medicare IM given by:    Discharge Disposition:    Per UR Regulation:    If discussed at Long Length of Stay Meetings, dates discussed:    Comments:  03/15/14 [place] RN, MSN, CM- Met with patient's daughter to discuss discharge plan. Patient will be discharged home this afternoon back under the [facility] of [facility]. Santiago Glad with [facility] is aware of plans for discharge.  Private duty agency list was provided to daughter, as she has expressed a desire to bring additional help into the home.  CSW is aware that patient will need ambulance transport at discharge.

## 2015-08-05 ENCOUNTER — Encounter: Payer: Self-pay | Admitting: Cardiology

## 2015-08-21 DEATH — deceased
